# Patient Record
Sex: Male | Born: 1950 | Race: White | Hispanic: No | Marital: Married | Smoking: Former smoker
Health system: Southern US, Community
[De-identification: ages and names within clinical notes are randomized; demographics above are authoritative.]

## PROBLEM LIST (undated history)

## (undated) ENCOUNTER — Emergency Department (HOSPITAL_COMMUNITY): Admission: EM | Payer: Self-pay | Source: Home / Self Care

## (undated) DIAGNOSIS — J069 Acute upper respiratory infection, unspecified: Secondary | ICD-10-CM

## (undated) DIAGNOSIS — M1990 Unspecified osteoarthritis, unspecified site: Secondary | ICD-10-CM

## (undated) DIAGNOSIS — I10 Essential (primary) hypertension: Secondary | ICD-10-CM

## (undated) HISTORY — PX: OTHER SURGICAL HISTORY: SHX169

## (undated) HISTORY — PX: JOINT REPLACEMENT: SHX530

## (undated) HISTORY — PX: BACK SURGERY: SHX140

## (undated) HISTORY — PX: TONSILLECTOMY: SUR1361

---

## 2006-07-31 ENCOUNTER — Ambulatory Visit (HOSPITAL_BASED_OUTPATIENT_CLINIC_OR_DEPARTMENT_OTHER): Admission: RE | Admit: 2006-07-31 | Discharge: 2006-07-31 | Payer: Self-pay | Admitting: Orthopaedic Surgery

## 2007-01-17 ENCOUNTER — Encounter: Admission: RE | Admit: 2007-01-17 | Discharge: 2007-01-17 | Payer: Self-pay | Admitting: Orthopaedic Surgery

## 2007-03-04 ENCOUNTER — Inpatient Hospital Stay (HOSPITAL_COMMUNITY): Admission: RE | Admit: 2007-03-04 | Discharge: 2007-03-06 | Payer: Self-pay | Admitting: Orthopaedic Surgery

## 2010-12-18 NOTE — Op Note (Signed)
Ricardo Anthony, Ricardo Anthony              ACCOUNT NO.:  [id number]   MEDICAL RECORD NO.:  [id number]          PATIENT TYPE:  INP   LOCATION:  2899                         FACILITY:  [facility]   PHYSICIAN:  Sharolyn Douglas, M.D.        DATE OF BIRTH:  1951/07/15   DATE OF PROCEDURE:  03/04/2007  DATE OF DISCHARGE:                               OPERATIVE REPORT   DIAGNOSIS:  Recurrent lumbar multilevel spinal stenosis.   PROCEDURE:  Revision lumbar laminectomy L2-3, L3-4, L4-5 and L5-S1 with  wide decompression of the thecal sac and nerve roots bilaterally.   SURGEON:  Sharolyn Douglas, MD.   ASSISTANT:  Aura Fey. Bobbe Medico.   ANESTHESIA:  General endotracheal.   ESTIMATED BLOOD LOSS:  100 mL.   COMPLICATIONS:  None.   NEEDLE AND SPONGE COUNT:  Correct.   INDICATIONS:  The patient is a pleasant 60 year old male with chronic  persistent back and bilateral lower extremity pain.  He has had a  previous lumbar laminectomy at L4-5.  He has failed all attempts other  conservative treatments and now elects to undergo revision laminectomy  from L2-S1.  His imaging studies show multilevels of disk degeneration  with chronic disk herniation and posterior element hypertrophy.  There  is a spinal stenosis most severe at L4-5 but also significant at the  other levels.  He has a good understanding of the surgery and has  elected to proceed.   PROCEDURE:  After informed consent he was taken to the operating room.  He underwent general endotracheal anesthesia without difficulty, was  given prophylactic IV antibiotics.  He was carefully turned prone onto  the Wilson frame.  All bony prominences padded.  Face eyes protected at  all times.  Back prepped, draped usual sterile fashion.  The previous  incision was utilized and extended several centimeters in each  direction.  Dissection was carried sharply through the scar tissue and a  subperiosteal exposure was carried out to the facette joint capsules.  The pars  interarticularis was identified, L2, L3, L4, and L5  bilaterally.  Deep retractors were placed an intraoperative x-ray was  taken to confirm the levels.  We then began the process of removing the  residual spinous process and lamina of L4 and L5.  We carried the  decompression up to L2 removing the entire spinous process of L2 and L3  as well.  We then used a high-speed bur to thin the lamina.  Care was  taken to protect the pars interarticularis.  We then used a Kerrison  punches along with loupes and headlight magnification to remove the  thinned lamina and ligamentum flavum which was hypertrophied.  Once the  central decompression was complete we began the process of the lateral  recess decompression.  We encountered significant scar primarily on the  right side at L3-4, L4-5 and this was carefully dissected using the  curettes.  The decompression was carried out flush with the pedicles.  We identified each nerve root  L3, L4, L5 and S1 bilaterally and confirmed that they were decompressed  in the lateral  recess and also within the foramen.  We then inspected  the disk at each level and found that there were chronic disk  herniations which were calcified.  We attempted to enter the disk space  on the left side at L4-5; however, the disk itself was calcified and  therefore it was not felt that further dissection would be possible  without completing a fusion.  We therefore elected to leave the anterior  chronic disk herniations.  Again we felt that posteriorly there was an  adequate decompression and each nerve root was exiting freely.  Hemostasis was achieved using Gelfoam and FloSeal.  A deep Hemovac drain  was left in place.  The deep fascia closed with a running #1 Vicryl  suture.  Subcutaneous layer closed with 0-0 Vicryl and 2-0 Vicryl  followed by a running 3-0 subcuticular Vicryl suture on the skin edges.  Dermabond was applied.  Sterile dressing placed.  Patient turned supine,   extubated without difficulty and transferred to recovery in stable  condition.   It should be noted my assistant Orlin Hilding, PA was present throughout  the procedure.  She assisted with the laminectomies using loupes and  headlight magnification.  She provided suction and retraction of the  neural elements.  She then assisted me with wound closure.      Sharolyn Douglas, M.D.  Electronically Signed     MC/MEDQ  D:  03/04/2007  T:  03/05/2007  Job:  [id number]

## 2010-12-21 NOTE — Op Note (Signed)
NAMEVENSON, FERENCZ              ACCOUNT NO.:  [id number]   MEDICAL RECORD NO.:  [id number]          PATIENT TYPE:  AMB   LOCATION:  DSC                          FACILITY:  [facility]   PHYSICIAN:  Sharolyn Douglas, M.D.        DATE OF BIRTH:  11/18/1950   DATE OF PROCEDURE:  07/31/2006  DATE OF DISCHARGE:                               OPERATIVE REPORT   DIAGNOSIS:  Left L3-4 lumbar radiculopathy.   PROCEDURES:  1. Left L3-4 transforaminal epidural steroid injection.  2. Fluoroscopic imaging used for needle placement during the above      injection.   SURGEON:  Sharolyn Douglas, M.D.   ASSISTANT:  None.   ANESTHESIA:  MAC, plus local.   COMPLICATIONS:  None.   INDICATIONS:  The patient is a pleasant 60 year old male with left lower  extremity radiculopathy thought to be secondary to the L3-4 segment.  He  now presents or left L3-4 transforaminal epidural injection in hopes of  improving his symptoms.  The risks, benefits and alternatives were  reviewed.   DESCRIPTION OF PROCEDURES:  After informed consent, he was taken to the  operating room.  He underwent sedation by Anesthesia.  He was turned  prone onto the operating room table.  The back was prepped and draped in  the usual sterile fashion.  Fluoroscopy was brought into the field, and  oblique imaging of the L3-4 segment was obtained.  Using a 22-gauge  Quincke spinal needle, the needle was advanced under the left L3  pedicle.  Aspiration showed no blood or CSF.  A small amount of  Omnipaque was injected, which showed some epidural spread medial to the  pedicle.  The patient immediately had concordant pain in his left leg,  which was identified to be very similar to his home pain.  Depo-Medrol  80 mg, 1 cc was injected, along with 4 cc 1% preservative-free  lidocaine.  The patient immediately had resolution of his pain.  The  needle was removed.  He was transferred to recovery in stable condition,  able to move his upper and lower  extremities, neurologically intact.  He  was comfortable in the holding area.  I reviewed post-injection  instructions with him.  He will follow up in 2 weeks.      Sharolyn Douglas, M.D.  Electronically Signed     MC/MEDQ  D:  07/31/2006  T:  08/01/2006  Job:  [id number]

## 2011-03-06 ENCOUNTER — Other Ambulatory Visit: Payer: Self-pay | Admitting: Orthopaedic Surgery

## 2011-03-06 ENCOUNTER — Ambulatory Visit (HOSPITAL_COMMUNITY)
Admission: RE | Admit: 2011-03-06 | Discharge: 2011-03-06 | Disposition: A | Discharge status: Home / Self Care | Payer: BC Managed Care – PPO | Source: Ambulatory Visit | Attending: Orthopaedic Surgery | Admitting: Orthopaedic Surgery

## 2011-03-06 ENCOUNTER — Encounter (HOSPITAL_COMMUNITY): Payer: BC Managed Care – PPO

## 2011-03-06 ENCOUNTER — Other Ambulatory Visit (HOSPITAL_COMMUNITY): Payer: Self-pay | Admitting: Orthopaedic Surgery

## 2011-03-06 DIAGNOSIS — M169 Osteoarthritis of hip, unspecified: Secondary | ICD-10-CM

## 2011-03-06 DIAGNOSIS — M1610 Unilateral primary osteoarthritis, unspecified hip: Secondary | ICD-10-CM | POA: Insufficient documentation

## 2011-03-06 DIAGNOSIS — I10 Essential (primary) hypertension: Secondary | ICD-10-CM | POA: Insufficient documentation

## 2011-03-06 DIAGNOSIS — Z01811 Encounter for preprocedural respiratory examination: Secondary | ICD-10-CM | POA: Insufficient documentation

## 2011-03-06 LAB — BASIC METABOLIC PANEL
Chloride: 99 mEq/L (ref 96–112)
GFR calc Af Amer: >60 mL/min (ref >60)
GFR calc non Af Amer: >60 mL/min (ref >60)
Potassium: 3.7 mEq/L (ref 3.5–5.1)
Sodium: 135 mEq/L (ref 135–145)

## 2011-03-06 LAB — APTT: aPTT: 36 seconds (ref 24–37)

## 2011-03-06 LAB — CBC
HCT: 38.5 % — ABNORMAL LOW (ref 39.0–52.0)
MCHC: 34.8 g/dL (ref 30.0–36.0)
Platelets: 162 10*3/uL (ref 150–400)
RDW: 12.9 % (ref 11.5–15.5)
WBC: 5.3 10*3/uL (ref 4.0–10.5)

## 2011-03-06 LAB — PROTIME-INR: INR: 0.98 (ref 0.00–1.49)

## 2011-03-07 ENCOUNTER — Encounter (HOSPITAL_COMMUNITY): Payer: BC Managed Care – PPO

## 2011-03-15 ENCOUNTER — Inpatient Hospital Stay (HOSPITAL_COMMUNITY): Payer: BC Managed Care – PPO

## 2011-03-15 ENCOUNTER — Inpatient Hospital Stay (HOSPITAL_COMMUNITY)
Admission: RE | Admit: 2011-03-15 | Discharge: 2011-03-18 | DRG: 818 | Disposition: A | Discharge status: Home Health | Payer: BC Managed Care – PPO | Source: Ambulatory Visit | Attending: Orthopaedic Surgery | Admitting: Orthopaedic Surgery

## 2011-03-15 DIAGNOSIS — I10 Essential (primary) hypertension: Secondary | ICD-10-CM | POA: Diagnosis present

## 2011-03-15 DIAGNOSIS — M169 Osteoarthritis of hip, unspecified: Principal | ICD-10-CM | POA: Diagnosis present

## 2011-03-15 DIAGNOSIS — E119 Type 2 diabetes mellitus without complications: Secondary | ICD-10-CM | POA: Diagnosis present

## 2011-03-15 DIAGNOSIS — Z87891 Personal history of nicotine dependence: Secondary | ICD-10-CM

## 2011-03-15 DIAGNOSIS — M1610 Unilateral primary osteoarthritis, unspecified hip: Principal | ICD-10-CM | POA: Diagnosis present

## 2011-03-15 LAB — GLUCOSE, CAPILLARY: Glucose-Capillary: 178 mg/dL — ABNORMAL HIGH (ref 70–99)

## 2011-03-15 LAB — TYPE AND SCREEN: ABO/RH(D): O POS

## 2011-03-15 LAB — ABO/RH: ABO/RH(D): O POS

## 2011-03-16 LAB — BASIC METABOLIC PANEL
BUN: 9 mg/dL (ref 6–23)
GFR calc Af Amer: >60 mL/min (ref >60)
GFR calc non Af Amer: >60 mL/min (ref >60)
Potassium: 3.5 mEq/L (ref 3.5–5.1)
Sodium: 131 mEq/L — ABNORMAL LOW (ref 135–145)

## 2011-03-16 LAB — CBC
MCV: 87.4 fL (ref 78.0–100.0)
Platelets: 146 10*3/uL — ABNORMAL LOW (ref 150–400)
RBC: 3.49 MIL/uL — ABNORMAL LOW (ref 4.22–5.81)
WBC: 8.8 10*3/uL (ref 4.0–10.5)

## 2011-03-16 NOTE — Op Note (Signed)
NAMEJAYMISON, LUBER NO.:  [id number]  MEDICAL RECORD NO.:  [id number]  LOCATION:  1615                         FACILITY:  [facility]  PHYSICIAN:  Vanita Panda. Magnus Ivan, M.D.DATE OF BIRTH:  June 02, 1951  DATE OF PROCEDURE:  03/15/2011 DATE OF DISCHARGE:                              OPERATIVE REPORT   PREOPERATIVE DIAGNOSES:  Severe osteoarthritis and degenerative joint disease, right hip.  POSTOPERATIVE DIAGNOSES:  Severe osteoarthritis and degenerative joint disease, right hip.  PROCEDURE:  Right total hip arthroplasty with direct anterior approach.  IMPLANTS:  Equities trader S hybrid component with Gription size 52, size 36 neutral plus 4 polyethylene liner, Corail HA coated femoral component size 11 (KLA), and ceramic 36 plus 1.5 hip ball.  SURGEON:  Vanita Panda. Magnus Ivan, M.D.  ANESTHESIA:  General.  ANTIBIOTICS:  200 mg clindamycin.  BLOOD LOSS:  700 cc  COMPLICATIONS:  None.  INDICATIONS:  Mr. Olguin is a 60 year old gentleman with severe bilateral hip osteoarthritis with the right hurting worse than the left. 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  The pain has been significant enough that he is at the point wish to proceed with a total hip arthroplasty.  The risks and benefits of the surgery has been explained to him in detail.  He does wish the previous surgery due to the fact he has failed conservative treatment, he has failed injections, he has got bone-on- bone documented through x-rays as well.  He understands in detail the risks and benefits of this.  DESCRIPTION OF THE PROCEDURE:  After informed consent was obtained, appropriate right hip was marked, and he was brought to the operating room and general anesthesia was attained when he was in the stretcher. A Foley catheter was placed and then traction boots were placed on both feet.  He was then placed on the Hana Fracture  Table with perineal post placed on both feet in traction boots with no traction applied.  His right hip was then prepped and draped with DuraPrep and sterile drapes. Time-out was called __________ the right hip.  I then made an incision 1 cm distal and 3 cm posterior to the anterior superior iliac spine.  I secured the incision obliquely down the leg.  I dissected down to the tensor fascia lata and then tensor fascia lata was divided longitudinally to allow me to proceed with the direct anterior approach. A curved retractor was placed around the lateral neck; and then up under the rectus femoris, curved retractor was placed medially.  I was then able to coagulate the lateral femoral circumflex vessels.  I then divided the hip capsule and put the cobras within the hip capsule to expose the femoral neck.  __________ was very short femoral neck, so I was able to directly visualize my femoral neck cut as well as see them under direct fluoroscopic guidance.  I made the femoral neck cut and then placed a corkscrew guide in the femoral head and then removed the femoral head in its entirety.  I then cleaned the acetabulum of debris and began reaming from size 42 reamer up to size 51 with the last few reamers  placed under direct fluoroscopic guidance to allow me to assess my inclination and anteversion.  I then placed the real size 52 acetabular component with Gription.  I placed the real 36 plus 4 neutral polyethylene liner.  Attention was then turned to the femur.  With all traction off the bed, I was able to externally rotate the femur to 90 degrees, then I extended and adducted hip.  This allowed access to the femoral canal.  We were able to clean tissue from around the greater trochanter, and then I used a box-cutting guide and broaches to begin broaching from a size 8 broach up to a size 11 broach.  The 11 broach felt to be solid and stable, so a trial of 36 plus 1.5 femoral head with a KLA  high offset varus neck and reduced this into the acetabulum.  It was felt to be stable, and he had no shock and stable rotation.  I felt that his leg lengths were near equal as well.  I then removed all trial instrumentation and trial components and placed the real KLA size 11 femoral component from Corail without difficulty.  We replaced the real 36 plus 1.5 ceramic hip ball and reduced this back into the acetabulum. I then copiously irrigated tissues and closed the joint capsule with interrupted #1 Ethibond.  I reapproximated the tensor fascia lata with a running #1 Vicryl followed by a 2-0 Vicryl in subcutaneous tissue and staples on the skin.  Xeroform followed by pad and sterile dressings applied.  The patient was taken off the Hana table, awakened, extubated, and taken to recovery room in stable condition.  All final counts were correct and there were no complications noted.     Vanita Panda. Magnus Ivan, M.D.     CYB/MEDQ  D:  03/15/2011  T:  03/16/2011  Job:  [id number]  Electronically Signed by Doneen Poisson M.D. on 03/16/2011 10:31:37 AM

## 2011-03-16 NOTE — H&P (Signed)
  NAMEBRINSON, TOZZI NO.:  [id number]  MEDICAL RECORD NO.:  [id number]  LOCATION:  1615                         FACILITY:  [facility]  PHYSICIAN:  Vanita Panda. Magnus Ivan, M.D.DATE OF BIRTH:  February 03, 1951  DATE OF ADMISSION:  03/15/2011 DATE OF DISCHARGE:                             HISTORY & PHYSICAL   CHIEF COMPLAINT:  Bilateral hip pain, right worse than left.  HISTORY OF PRESENT ILLNESS:  Mr. Pennella is a 60 year old gentleman who is well-known to me.  He has severe pain in his hips for sometime and x- ray showed significant arthritis in his hips and what I believe is likely dysplastic hips with short femoral necks.  He is experiencing bone-on-bone type of wear and he has got to the point where he wishes to proceed with a total hip arthroplasty.  The risks and benefits of this had been explained to him in length.  He had steroid injections even in his hips and this has not helped.  He has tried everything, but has gotten to where it has impacted his daily life and his activities of daily living.  He wishes to proceed with a total hip arthroplasty at this standpoint and he understands the risks and benefits of this and what it involves.  PAST MEDICAL HISTORY: 1. Arthritis. 2. Diabetes. 3. High blood pressure.  MEDICATIONS:  Amlodipine, quinapril, low-dose aspirin, and multivitamin.  ALLERGIES:  No known drug allergies.  FAMILY MEDICAL HISTORY:  Diabetes and high blood pressure.  SOCIAL HISTORY:  He is married.  He is a Naval architect.  He does not smoke, but he did smoke for about 35 years.  He does drink alcohol on occasion.  REVIEW OF SYSTEMS:  Negative for chest pain, shortness of breath, fever, chills, nausea, vomiting.  PHYSICAL EXAMINATION:  VITAL SIGNS:  He is afebrile with stable vital signs. GENERAL:  He is alert and oriented x3, no acute distress, obvious discomfort. HEENT:  Normocephalic, atraumatic.  Pupils equal, round, and reactive  to light. NECK:  Supple. LUNGS:  Clear to auscultation bilaterally. HEART:  Regular rate and rhythm. ABDOMEN:  Benign. EXTREMITIES:  His leg lengths appear equal.  He is neurovascularly intact.  He has severe pain with internal and external rotation of both hips.  X-rays reviewed and showed significant deformity of his right and left hips with bone-on-bone wear, short femoral necks, and dysplastic acetabulum.  ASSESSMENT:  This is a 60 year old gentleman with end-stage arthritis of his hips, right worse than left.  PLAN:  We will proceed today with a right total hip arthroplasty.  The risks and benefits of this again have been understood by him in detail.     Vanita Panda. Magnus Ivan, M.D.     CYB/MEDQ  D:  03/15/2011  T:  03/15/2011  Job:  [id number]  Electronically Signed by Doneen Poisson M.D. on 03/16/2011 10:31:35 AM

## 2011-03-17 LAB — CBC
Platelets: 134 10*3/uL — ABNORMAL LOW (ref 150–400)
RBC: 3.32 MIL/uL — ABNORMAL LOW (ref 4.22–5.81)
RDW: 12.7 % (ref 11.5–15.5)
WBC: 7.8 10*3/uL (ref 4.0–10.5)

## 2011-03-17 LAB — BASIC METABOLIC PANEL
CO2: 29 mEq/L (ref 19–32)
Calcium: 8.2 mg/dL — ABNORMAL LOW (ref 8.4–10.5)
Creatinine, Ser: 0.55 mg/dL (ref 0.50–1.35)
GFR calc non Af Amer: >60 mL/min (ref >60)
Sodium: 134 mEq/L — ABNORMAL LOW (ref 135–145)

## 2011-03-18 LAB — CBC
HCT: 26.2 % — ABNORMAL LOW (ref 39.0–52.0)
Hemoglobin: 9.2 g/dL — ABNORMAL LOW (ref 13.0–17.0)
RDW: 12.7 % (ref 11.5–15.5)
WBC: 7 10*3/uL (ref 4.0–10.5)

## 2011-03-18 LAB — BASIC METABOLIC PANEL
BUN: 9 mg/dL (ref 6–23)
Chloride: 98 mEq/L (ref 96–112)
Creatinine, Ser: 0.61 mg/dL (ref 0.50–1.35)

## 2011-03-19 NOTE — Discharge Summary (Signed)
  NAMELEOPOLD, SMYERS NO.:  [id number]  MEDICAL RECORD NO.:  [id number]  LOCATION:  1615                         FACILITY:  [facility]  PHYSICIAN:  Vanita Panda. Magnus Ivan, M.D.DATE OF BIRTH:  11-15-1950  DATE OF ADMISSION:  03/15/2011 DATE OF DISCHARGE:  03/18/2011                              DISCHARGE SUMMARY   PREOPERATIVE DIAGNOSES:  Severe osteoarthritis and degenerative joint disease, right hip.  DISCHARGE DIAGNOSES:  Severe osteoarthritis and degenerative joint disease, right hip.  PROCEDURE:  Right total hip arthroplasty on March 15, 2011.  HOSPITAL COURSE:  Mr. Hissong is a 60 year old gentleman with debilitating arthritis of his right hip.  He was taken to the operating room on March 15, 2011 where he underwent a direct anterior total hip arthroplasty.  He tolerated this procedure well, was admitted as inpatient to the orthopedic floor of [facility].  His hospital course was uneventful.  He did have acute blood loss anemia with hemoglobin down to 9.2 but which did not require any type of treatment. By the day of discharge, he was ambulating well.  He had no hip precautions with physical therapy working with balance, gait training, and mobility.  It was felt he can be discharged safely to home.  His incision was clean, dry and intact.  He was tolerating pain medications as well as oral diet.  DISPOSITION:  Discharged to home.  DISCHARGE MEDICATIONS: 1. Percocet. 2. Robaxin. 3. Xarelto. 4. Continue same home medications including     quinapril/hydrochlorothiazide, Humira, hydroxychloroquine, folate,     multivitamin, Colace, baby aspirin, amlodipine, leflunomide.  DISCHARGE INSTRUCTIONS:  While he is at home, he can get his incision wet at shower starting this Wednesday.  He will continue to work with home health therapy with balance, gait training, coordination and no hip precautions.  A followup appointment will be established in  my office in 2 weeks.     Vanita Panda. Magnus Ivan, M.D.     CYB/MEDQ  D:  03/18/2011  T:  03/18/2011  Job:  [id number]  Electronically Signed by Doneen Poisson M.D. on 03/19/2011 08:59:34 PM

## 2011-05-20 LAB — APTT: aPTT: 36

## 2011-05-20 LAB — DIFFERENTIAL
Basophils Absolute: 0
Eosinophils Relative: 1
Lymphocytes Relative: 34
Lymphs Abs: 2.2
Monocytes Absolute: 0.6
Neutro Abs: 3.6

## 2011-05-20 LAB — TYPE AND SCREEN: Antibody Screen: NEGATIVE

## 2011-05-20 LAB — COMPREHENSIVE METABOLIC PANEL
ALT: 30
BUN: 12
CO2: 28
Calcium: 9.7
GFR calc Af Amer: >60
GFR calc non Af Amer: >60
Glucose, Bld: 110 — ABNORMAL HIGH
Sodium: 133 — ABNORMAL LOW

## 2011-05-20 LAB — URINALYSIS, ROUTINE W REFLEX MICROSCOPIC
Glucose, UA: NEGATIVE
Ketones, ur: NEGATIVE
Nitrite: NEGATIVE
Protein, ur: NEGATIVE
pH: 6.5

## 2011-05-20 LAB — CBC
MCHC: 34.1
MCV: 95
Platelets: 211
WBC: 6.4

## 2011-05-20 LAB — URINE CULTURE: Culture: NO GROWTH

## 2011-05-20 LAB — PROTIME-INR: Prothrombin Time: 13.4

## 2011-06-13 ENCOUNTER — Encounter (HOSPITAL_COMMUNITY): Payer: Self-pay | Admitting: Pharmacy Technician

## 2011-06-21 ENCOUNTER — Encounter (HOSPITAL_COMMUNITY)
Admission: RE | Admit: 2011-06-21 | Discharge: 2011-06-21 | Disposition: A | Discharge status: Home / Self Care | Payer: BC Managed Care – PPO | Source: Ambulatory Visit | Attending: Orthopaedic Surgery | Admitting: Orthopaedic Surgery

## 2011-06-21 ENCOUNTER — Other Ambulatory Visit (HOSPITAL_COMMUNITY): Payer: Self-pay

## 2011-06-21 ENCOUNTER — Encounter (HOSPITAL_COMMUNITY): Payer: Self-pay

## 2011-06-21 HISTORY — DX: Essential (primary) hypertension: I10

## 2011-06-21 HISTORY — DX: Unspecified osteoarthritis, unspecified site: M19.90

## 2011-06-21 HISTORY — DX: Acute upper respiratory infection, unspecified: J06.9

## 2011-06-21 LAB — BASIC METABOLIC PANEL
BUN: 11 mg/dL (ref 6–23)
CO2: 27 mEq/L (ref 19–32)
Chloride: 101 mEq/L (ref 96–112)
GFR calc non Af Amer: >90 mL/min (ref >90)
Glucose, Bld: 110 mg/dL — ABNORMAL HIGH (ref 70–99)
Potassium: 4.1 mEq/L (ref 3.5–5.1)

## 2011-06-21 LAB — CBC
HCT: 39.1 % (ref 39.0–52.0)
Hemoglobin: 13.3 g/dL (ref 13.0–17.0)
MCHC: 34 g/dL (ref 30.0–36.0)

## 2011-06-21 LAB — APTT: aPTT: 35 seconds (ref 24–37)

## 2011-06-21 LAB — URINALYSIS, ROUTINE W REFLEX MICROSCOPIC
Glucose, UA: NEGATIVE mg/dL
Ketones, ur: NEGATIVE mg/dL
Leukocytes, UA: NEGATIVE
Nitrite: NEGATIVE
Specific Gravity, Urine: 1.009 (ref 1.005–1.030)
pH: 5.5 (ref 5.0–8.0)

## 2011-06-21 LAB — SURGICAL PCR SCREEN
MRSA, PCR: NEGATIVE
Staphylococcus aureus: NEGATIVE

## 2011-06-21 MED ORDER — CHLORHEXIDINE GLUCONATE 4 % EX LIQD: 60.0000 mL | Freq: Once | CUTANEOUS | Status: DC

## 2011-06-21 MED ORDER — CHLORHEXIDINE GLUCONATE 4 % EX LIQD
60.0000 mL | Freq: Once | CUTANEOUS | Status: DC
Start: 2011-06-21 — End: 2011-06-21

## 2011-06-21 NOTE — Pre-Procedure Instructions (Signed)
Faxed clarification request to Dr Magnus Ivan regarding ancef order- allergic to penicillin- 06/21/11 with confirmation

## 2011-06-21 NOTE — Patient Instructions (Signed)
20 Duvid Smalls  06/21/2011   Your procedure is scheduled on:  06/28/2011   [id number]  FRIDAY  Report to [facility] at 0825 AM.  Call this number if you have problems the morning of surgery: [phone]   Remember:   Do not eat food:After Midnight. Thursday NIGHT  Do not drink clear liquids: After Midnight.  Thursday NIGHT  Take these medicines the morning of surgery with A SIP OF WATER: AMLODOPINE with sip water,   TRAMODOL if needed with sip water   Do not wear jewelry, make-up or nail polish.  Do not wear lotions, powders, or perfumes. You may wear deodorant.  Do not shave 48 hours prior to surgery.  Do not bring valuables to the hospital.  Contacts, dentures or bridgework may not be worn into surgery.  Leave suitcase in the car. After surgery it may be brought to your room.  For patients admitted to the hospital, checkout time is 11:00 AM the day of discharge.   Patients discharged the day of surgery will not be allowed to drive home.  Name and phone number of your driver: wife  Special Instructions: CHG Shower Use Special Wash: 1/2 bottle night before surgery and 1/2 bottle morning of surgery. REGULAR SOAP FACE AND PRIVATES   Please read over the following fact sheets that you were given: MRSA Information

## 2011-06-24 ENCOUNTER — Encounter (HOSPITAL_COMMUNITY): Payer: Self-pay

## 2011-06-24 NOTE — Pre-Procedure Instructions (Signed)
EKG obtained  From [facility] dated 5/12 on chart. Also eccho from 2/12 on chart

## 2011-06-24 NOTE — Pre-Procedure Instructions (Signed)
Chest 8/12 report in Epic

## 2011-06-27 LAB — TYPE AND SCREEN
ABO/RH(D): O POS
Antibody Screen: NEGATIVE

## 2011-06-28 ENCOUNTER — Inpatient Hospital Stay (HOSPITAL_COMMUNITY): Payer: BC Managed Care – PPO

## 2011-06-28 ENCOUNTER — Encounter (HOSPITAL_COMMUNITY): Payer: Self-pay | Admitting: Anesthesiology

## 2011-06-28 ENCOUNTER — Inpatient Hospital Stay (HOSPITAL_COMMUNITY)
Admission: RE | Admit: 2011-06-28 | Discharge: 2011-07-01 | DRG: 818 | Disposition: A | Discharge status: Home Health | Payer: BC Managed Care – PPO | Source: Ambulatory Visit | Attending: Orthopaedic Surgery | Admitting: Orthopaedic Surgery

## 2011-06-28 ENCOUNTER — Encounter (HOSPITAL_COMMUNITY): Payer: Self-pay | Admitting: *Deleted

## 2011-06-28 ENCOUNTER — Inpatient Hospital Stay (HOSPITAL_COMMUNITY): Payer: BC Managed Care – PPO | Admitting: Anesthesiology

## 2011-06-28 ENCOUNTER — Encounter (HOSPITAL_COMMUNITY)
Admission: RE | Disposition: A | Discharge status: Home Health | Payer: Self-pay | Source: Ambulatory Visit | Attending: Orthopaedic Surgery

## 2011-06-28 DIAGNOSIS — Z01812 Encounter for preprocedural laboratory examination: Secondary | ICD-10-CM

## 2011-06-28 DIAGNOSIS — I10 Essential (primary) hypertension: Secondary | ICD-10-CM | POA: Diagnosis present

## 2011-06-28 DIAGNOSIS — M1610 Unilateral primary osteoarthritis, unspecified hip: Principal | ICD-10-CM

## 2011-06-28 DIAGNOSIS — Q6589 Other specified congenital deformities of hip: Secondary | ICD-10-CM

## 2011-06-28 DIAGNOSIS — Z96649 Presence of unspecified artificial hip joint: Secondary | ICD-10-CM

## 2011-06-28 DIAGNOSIS — M169 Osteoarthritis of hip, unspecified: Principal | ICD-10-CM | POA: Diagnosis present

## 2011-06-28 DIAGNOSIS — E119 Type 2 diabetes mellitus without complications: Secondary | ICD-10-CM | POA: Diagnosis present

## 2011-06-28 HISTORY — PX: TOTAL HIP ARTHROPLASTY: SHX124

## 2011-06-28 LAB — GLUCOSE, CAPILLARY: Glucose-Capillary: 148 mg/dL — ABNORMAL HIGH (ref 70–99)

## 2011-06-28 SURGERY — ARTHROPLASTY, HIP, TOTAL, ANTERIOR APPROACH
Anesthesia: General | Site: Hip | Laterality: Left | Wound class: Clean

## 2011-06-28 MED ORDER — DIPHENHYDRAMINE HCL 50 MG/ML IJ SOLN: 12.5000 mg | Freq: Four times a day (QID) | INTRAMUSCULAR | Status: DC | PRN

## 2011-06-28 MED ORDER — RIVAROXABAN 10 MG PO TABS
10.0000 mg | ORAL_TABLET | ORAL | Status: DC
  Administered 2011-06-29 – 2011-06-30 (×2): 10 mg via ORAL
  Filled 2011-06-28 (×3): qty 1

## 2011-06-28 MED ORDER — GLYCOPYRROLATE 0.2 MG/ML IJ SOLN
INTRAMUSCULAR | Status: DC | PRN
  Administered 2011-06-28: .6 mg via INTRAVENOUS

## 2011-06-28 MED ORDER — CLINDAMYCIN PHOSPHATE 600 MG/50ML IV SOLN
600.0000 mg | INTRAVENOUS | Status: AC
  Administered 2011-06-28: 600 mg via INTRAVENOUS
  Filled 2011-06-28: qty 50

## 2011-06-28 MED ORDER — FLEET ENEMA 7-19 GM/118ML RE ENEM: 1.0000 | ENEMA | Freq: Every day | RECTAL | Status: DC | PRN

## 2011-06-28 MED ORDER — ONDANSETRON HCL 4 MG/2ML IJ SOLN
INTRAMUSCULAR | Status: DC | PRN
  Administered 2011-06-28: 4 mg via INTRAVENOUS

## 2011-06-28 MED ORDER — MENTHOL 3 MG MT LOZG
1.0000 | LOZENGE | OROMUCOSAL | Status: DC | PRN
  Filled 2011-06-28: qty 9

## 2011-06-28 MED ORDER — POLYETHYLENE GLYCOL 3350 17 G PO PACK
17.0000 g | PACK | Freq: Every day | ORAL | Status: DC | PRN
  Filled 2011-06-28: qty 1

## 2011-06-28 MED ORDER — ONDANSETRON HCL 4 MG PO TABS: 4.0000 mg | ORAL_TABLET | Freq: Four times a day (QID) | ORAL | Status: DC | PRN

## 2011-06-28 MED ORDER — SODIUM CHLORIDE 0.9 % IR SOLN
Status: DC | PRN
  Administered 2011-06-28: 1000 mL

## 2011-06-28 MED ORDER — DIPHENHYDRAMINE HCL 12.5 MG/5ML PO ELIX
12.5000 mg | ORAL_SOLUTION | Freq: Four times a day (QID) | ORAL | Status: DC | PRN
  Filled 2011-06-28: qty 5

## 2011-06-28 MED ORDER — HYDROXYCHLOROQUINE SULFATE 200 MG PO TABS
200.0000 mg | ORAL_TABLET | Freq: Every evening | ORAL | Status: DC
  Administered 2011-06-28 – 2011-06-30 (×3): 200 mg via ORAL
  Filled 2011-06-28 (×5): qty 1

## 2011-06-28 MED ORDER — PROPOFOL 10 MG/ML IV EMUL
INTRAVENOUS | Status: DC | PRN
  Administered 2011-06-28: 180 mg via INTRAVENOUS

## 2011-06-28 MED ORDER — LACTATED RINGERS IV SOLN: INTRAVENOUS | Status: DC

## 2011-06-28 MED ORDER — ROCURONIUM BROMIDE 100 MG/10ML IV SOLN
INTRAVENOUS | Status: DC | PRN
  Administered 2011-06-28: 50 mg via INTRAVENOUS

## 2011-06-28 MED ORDER — CLINDAMYCIN PHOSPHATE 600 MG/50ML IV SOLN
600.0000 mg | Freq: Four times a day (QID) | INTRAVENOUS | Status: AC
  Administered 2011-06-28 – 2011-06-29 (×3): 600 mg via INTRAVENOUS
  Filled 2011-06-28 (×4): qty 50

## 2011-06-28 MED ORDER — AMLODIPINE BESYLATE 10 MG PO TABS
10.0000 mg | ORAL_TABLET | Freq: Every day | ORAL | Status: DC
  Administered 2011-06-30 – 2011-07-01 (×2): 10 mg via ORAL
  Filled 2011-06-28 (×3): qty 1

## 2011-06-28 MED ORDER — LISINOPRIL 20 MG PO TABS
20.0000 mg | ORAL_TABLET | Freq: Every day | ORAL | Status: DC
  Administered 2011-06-28 – 2011-07-01 (×3): 20 mg via ORAL
  Filled 2011-06-28 (×4): qty 1

## 2011-06-28 MED ORDER — HYDROCHLOROTHIAZIDE 12.5 MG PO CAPS
12.5000 mg | ORAL_CAPSULE | Freq: Every day | ORAL | Status: DC
  Administered 2011-06-28 – 2011-07-01 (×3): 12.5 mg via ORAL
  Filled 2011-06-28 (×4): qty 1

## 2011-06-28 MED ORDER — MAGNESIUM HYDROXIDE 400 MG/5ML PO SUSP: 30.0000 mL | Freq: Two times a day (BID) | ORAL | Status: DC | PRN

## 2011-06-28 MED ORDER — PROMETHAZINE HCL 25 MG/ML IJ SOLN: 6.2500 mg | INTRAMUSCULAR | Status: DC | PRN

## 2011-06-28 MED ORDER — ALUM & MAG HYDROXIDE-SIMETH 200-200-20 MG/5ML PO SUSP: 30.0000 mL | ORAL | Status: DC | PRN

## 2011-06-28 MED ORDER — METOCLOPRAMIDE HCL 5 MG/ML IJ SOLN: 5.0000 mg | Freq: Three times a day (TID) | INTRAMUSCULAR | Status: DC | PRN

## 2011-06-28 MED ORDER — METHOCARBAMOL 500 MG PO TABS
500.0000 mg | ORAL_TABLET | Freq: Four times a day (QID) | ORAL | Status: DC | PRN
  Administered 2011-06-30 – 2011-07-01 (×2): 500 mg via ORAL
  Filled 2011-06-28 (×3): qty 1

## 2011-06-28 MED ORDER — MORPHINE SULFATE (PF) 1 MG/ML IV SOLN
INTRAVENOUS | Status: DC
  Administered 2011-06-28: 1 mg via INTRAVENOUS
  Administered 2011-06-28: 14:00:00 via INTRAVENOUS
  Administered 2011-06-28 (×2): 6 mg via INTRAVENOUS
  Administered 2011-06-29: 9 mg via INTRAVENOUS
  Administered 2011-06-29: 3 mg via INTRAVENOUS
  Administered 2011-06-29: 7.25 mg via INTRAVENOUS
  Administered 2011-06-29: 6 mg via INTRAVENOUS
  Administered 2011-06-29: 07:00:00 via INTRAVENOUS
  Administered 2011-06-30: 3 mg via INTRAVENOUS
  Administered 2011-06-30: 2.93 mg via INTRAVENOUS
  Administered 2011-06-30: 07:00:00 via INTRAVENOUS
  Administered 2011-06-30: 3 mg via INTRAVENOUS
  Filled 2011-06-28 (×3): qty 25

## 2011-06-28 MED ORDER — OXYCODONE HCL 5 MG PO TABS: 5.0000 mg | ORAL_TABLET | ORAL | Status: DC | PRN

## 2011-06-28 MED ORDER — NEOSTIGMINE METHYLSULFATE 1 MG/ML IJ SOLN
INTRAMUSCULAR | Status: DC | PRN
  Administered 2011-06-28: 3 mg via INTRAVENOUS

## 2011-06-28 MED ORDER — LEFLUNOMIDE 10 MG PO TABS
10.0000 mg | ORAL_TABLET | Freq: Every evening | ORAL | Status: DC
  Administered 2011-06-29 – 2011-06-30 (×2): 10 mg via ORAL
  Filled 2011-06-28 (×2): qty 1

## 2011-06-28 MED ORDER — ONDANSETRON HCL 4 MG/2ML IJ SOLN
4.0000 mg | Freq: Four times a day (QID) | INTRAMUSCULAR | Status: DC | PRN
  Administered 2011-06-28: 4 mg via INTRAVENOUS
  Filled 2011-06-28: qty 2

## 2011-06-28 MED ORDER — NALOXONE HCL 0.4 MG/ML IJ SOLN: 0.4000 mg | INTRAMUSCULAR | Status: DC | PRN

## 2011-06-28 MED ORDER — METOCLOPRAMIDE HCL 5 MG PO TABS
5.0000 mg | ORAL_TABLET | Freq: Three times a day (TID) | ORAL | Status: DC | PRN
  Filled 2011-06-28: qty 2

## 2011-06-28 MED ORDER — MIDAZOLAM HCL 5 MG/5ML IJ SOLN
INTRAMUSCULAR | Status: DC | PRN
  Administered 2011-06-28 (×2): 1 mg via INTRAVENOUS

## 2011-06-28 MED ORDER — HYDROMORPHONE HCL PF 1 MG/ML IJ SOLN
INTRAMUSCULAR | Status: DC | PRN
  Administered 2011-06-28: 0.5 mg via INTRAVENOUS
  Administered 2011-06-28: .5 mg via INTRAVENOUS
  Administered 2011-06-28 (×2): 0.5 mg via INTRAVENOUS

## 2011-06-28 MED ORDER — ACETAMINOPHEN 650 MG RE SUPP: 650.0000 mg | Freq: Four times a day (QID) | RECTAL | Status: DC | PRN

## 2011-06-28 MED ORDER — ACETAMINOPHEN 325 MG PO TABS: 650.0000 mg | ORAL_TABLET | Freq: Four times a day (QID) | ORAL | Status: DC | PRN

## 2011-06-28 MED ORDER — HYDROMORPHONE HCL PF 1 MG/ML IJ SOLN
0.2500 mg | INTRAMUSCULAR | Status: DC | PRN
  Administered 2011-06-28 (×4): 0.5 mg via INTRAVENOUS

## 2011-06-28 MED ORDER — MORPHINE SULFATE 2 MG/ML IJ SOLN: 1.0000 mg | INTRAMUSCULAR | Status: DC | PRN

## 2011-06-28 MED ORDER — LACTATED RINGERS IV SOLN
INTRAVENOUS | Status: DC | PRN
  Administered 2011-06-28 (×4): via INTRAVENOUS

## 2011-06-28 MED ORDER — FOLIC ACID 1 MG PO TABS
1.0000 mg | ORAL_TABLET | Freq: Every day | ORAL | Status: DC
  Administered 2011-06-28 – 2011-07-01 (×4): 1 mg via ORAL
  Filled 2011-06-28 (×4): qty 1

## 2011-06-28 MED ORDER — ONDANSETRON HCL 4 MG/2ML IJ SOLN: 4.0000 mg | Freq: Four times a day (QID) | INTRAMUSCULAR | Status: DC | PRN

## 2011-06-28 MED ORDER — QUINAPRIL-HYDROCHLOROTHIAZIDE 20-12.5 MG PO TABS: 1.0000 | ORAL_TABLET | Freq: Two times a day (BID) | ORAL | Status: DC

## 2011-06-28 MED ORDER — SODIUM CHLORIDE 0.9 % IJ SOLN: 9.0000 mL | INTRAMUSCULAR | Status: DC | PRN

## 2011-06-28 MED ORDER — ZOLPIDEM TARTRATE 5 MG PO TABS: 5.0000 mg | ORAL_TABLET | Freq: Every evening | ORAL | Status: DC | PRN

## 2011-06-28 MED ORDER — SODIUM CHLORIDE 0.9 % IV SOLN
INTRAVENOUS | Status: DC
  Administered 2011-06-28 – 2011-06-29 (×3): via INTRAVENOUS

## 2011-06-28 MED ORDER — HYDROCODONE-ACETAMINOPHEN 5-325 MG PO TABS
1.0000 | ORAL_TABLET | ORAL | Status: DC | PRN
  Administered 2011-06-30 – 2011-07-01 (×3): 2 via ORAL
  Filled 2011-06-28 (×4): qty 2

## 2011-06-28 MED ORDER — LIDOCAINE HCL (CARDIAC) 20 MG/ML IV SOLN
INTRAVENOUS | Status: DC | PRN
  Administered 2011-06-28: 100 mg via INTRAVENOUS

## 2011-06-28 MED ORDER — DOCUSATE SODIUM 100 MG PO CAPS
100.0000 mg | ORAL_CAPSULE | Freq: Two times a day (BID) | ORAL | Status: DC
  Administered 2011-06-28 – 2011-07-01 (×6): 100 mg via ORAL
  Filled 2011-06-28 (×8): qty 1

## 2011-06-28 MED ORDER — DEXTROSE 5 % IV SOLN
500.0000 mg | Freq: Four times a day (QID) | INTRAVENOUS | Status: DC | PRN
  Administered 2011-06-28: 500 mg via INTRAVENOUS
  Filled 2011-06-28 (×3): qty 5

## 2011-06-28 MED ORDER — FENTANYL CITRATE 0.05 MG/ML IJ SOLN
INTRAMUSCULAR | Status: DC | PRN
  Administered 2011-06-28 (×3): 50 ug via INTRAVENOUS
  Administered 2011-06-28: 100 ug via INTRAVENOUS

## 2011-06-28 MED ORDER — BISACODYL 5 MG PO TBEC: 10.0000 mg | DELAYED_RELEASE_TABLET | Freq: Every day | ORAL | Status: DC | PRN

## 2011-06-28 MED ORDER — FERROUS SULFATE 325 (65 FE) MG PO TABS
325.0000 mg | ORAL_TABLET | Freq: Three times a day (TID) | ORAL | Status: DC
  Administered 2011-06-28 – 2011-07-01 (×7): 325 mg via ORAL
  Filled 2011-06-28 (×10): qty 1

## 2011-06-28 MED ORDER — DIPHENHYDRAMINE HCL 12.5 MG/5ML PO ELIX: 12.5000 mg | ORAL_SOLUTION | ORAL | Status: DC | PRN

## 2011-06-28 MED ORDER — ACETAMINOPHEN 10 MG/ML IV SOLN
INTRAVENOUS | Status: DC | PRN
  Administered 2011-06-28: 1000 mg via INTRAVENOUS

## 2011-06-28 MED ORDER — PHENOL 1.4 % MT LIQD
1.0000 | OROMUCOSAL | Status: DC | PRN
  Filled 2011-06-28: qty 177

## 2011-06-28 MED ORDER — BISACODYL 10 MG RE SUPP: 10.0000 mg | Freq: Every day | RECTAL | Status: DC | PRN

## 2011-06-28 SURGICAL SUPPLY — 38 items
BAG SPEC THK2 15X12 ZIP CLS (MISCELLANEOUS) ×2
BAG ZIPLOCK 12X15 (MISCELLANEOUS) ×4 IMPLANT
BLADE SAW SGTL 18X1.27X75 (BLADE) ×2 IMPLANT
CELLS DAT CNTRL 66122 CELL SVR (MISCELLANEOUS) ×1 IMPLANT
CLOTH BEACON ORANGE TIMEOUT ST (SAFETY) ×2 IMPLANT
DRAPE C-ARM 42X72 X-RAY (DRAPES) ×2 IMPLANT
DRAPE ORTHO SPLIT 77X108 STRL (DRAPES) ×2
DRAPE STERI IOBAN 125X83 (DRAPES) ×2 IMPLANT
DRAPE SURG ORHT 6 SPLT 77X108 (DRAPES) ×1 IMPLANT
DRAPE U-SHAPE 47X51 STRL (DRAPES) ×6 IMPLANT
DRSG MEPILEX BORDER 4X8 (GAUZE/BANDAGES/DRESSINGS) ×2 IMPLANT
ELECT BLADE TIP CTD 4 INCH (ELECTRODE) ×3 IMPLANT
ELECT REM PT RETURN 9FT ADLT (ELECTROSURGICAL) ×2
ELECTRODE REM PT RTRN 9FT ADLT (ELECTROSURGICAL) ×1 IMPLANT
EVACUATOR 1/8 PVC DRAIN (DRAIN) IMPLANT
FACESHIELD LNG OPTICON STERILE (SAFETY) ×8 IMPLANT
GAUZE XEROFORM 1X8 LF (GAUZE/BANDAGES/DRESSINGS) ×2 IMPLANT
GAUZE XEROFORM 4X4 STRL (GAUZE/BANDAGES/DRESSINGS) ×1 IMPLANT
GLOVE BIO SURGEON STRL SZ7 (GLOVE) ×2 IMPLANT
GLOVE BIO SURGEON STRL SZ7.5 (GLOVE) ×2 IMPLANT
GLOVE BIOGEL PI IND STRL 7.5 (GLOVE) IMPLANT
GLOVE BIOGEL PI IND STRL 8 (GLOVE) ×1 IMPLANT
GLOVE BIOGEL PI INDICATOR 7.5 (GLOVE)
GLOVE BIOGEL PI INDICATOR 8 (GLOVE) ×1
GOWN STRL REIN XL XLG (GOWN DISPOSABLE) ×2 IMPLANT
KIT BASIN OR (CUSTOM PROCEDURE TRAY) ×2 IMPLANT
PACK TOTAL JOINT (CUSTOM PROCEDURE TRAY) ×2 IMPLANT
PADDING CAST COTTON 6X4 STRL (CAST SUPPLIES) ×2 IMPLANT
RETRACTOR WND ALEXIS 18 MED (MISCELLANEOUS) ×1 IMPLANT
RTRCTR WOUND ALEXIS 18CM MED (MISCELLANEOUS) ×2
STAPLER SKIN PROX WIDE 3.9 (STAPLE) IMPLANT
SUT ETHIBOND NAB CT1 #1 30IN (SUTURE) ×4 IMPLANT
SUT MNCRL AB 4-0 PS2 18 (SUTURE) ×2 IMPLANT
SUT VIC AB 1 CT1 36 (SUTURE) ×4 IMPLANT
SUT VIC AB 2-0 CT1 27 (SUTURE) ×4
SUT VIC AB 2-0 CT1 TAPERPNT 27 (SUTURE) ×2 IMPLANT
TOWEL OR 17X26 10 PK STRL BLUE (TOWEL DISPOSABLE) ×4 IMPLANT
TRAY FOLEY CATH 14FRSI W/METER (CATHETERS) ×2 IMPLANT

## 2011-06-28 NOTE — Brief Op Note (Signed)
06/28/2011  1:29 PM  PATIENT:  Camie Patience  60 y.o. male  PRE-OPERATIVE DIAGNOSIS:  Severe Left Hip Osteoarthritis  POST-OPERATIVE DIAGNOSIS:  Severe Left Hip Osteoarthritis  PROCEDURE:  Procedure(s): TOTAL HIP ARTHROPLASTY ANTERIOR APPROACH  SURGEON:  Surgeon(s): Kathryne Hitch  PHYSICIAN ASSISTANT: none  ASSISTANTS:    ANESTHESIA:   general  EBL:  Total I/O In: 3000 [I.V.:3000] Out: 1100 [Urine:250; Blood:850]  BLOOD ADMINISTERED:none  DRAINS: none   LOCAL MEDICATIONS USED:  NONE  SPECIMEN:  No Specimen  DISPOSITION OF SPECIMEN:  N/A  COUNTS:  YES  TOURNIQUET:  * No tourniquets in log *  DICTATION: .Other Dictation: Dictation Number [id number]  PLAN OF CARE: Admit to inpatient   PATIENT DISPOSITION:  PACU - hemodynamically stable.   Delay start of Pharmacological VTE agent (>24hrs) due to surgical blood loss or risk of bleeding:  {YES/NO/NOT APPLICABLE:20182

## 2011-06-28 NOTE — Anesthesia Postprocedure Evaluation (Signed)
  Anesthesia Post-op Note  Patient: Ricardo Anthony  Procedure(s) Performed:  TOTAL HIP ARTHROPLASTY ANTERIOR APPROACH  Patient Location: PACU  Anesthesia Type: General  Level of Consciousness: oriented and sedated  Airway and Oxygen Therapy: Patient Spontanous Breathing and Patient connected to nasal cannula oxygen  Post-op Pain: mild  Post-op Assessment: Post-op Vital signs reviewed, Patient's Cardiovascular Status Stable, Respiratory Function Stable and Patent Airway  Post-op Vital Signs: stable  Complications: No apparent anesthesia complications

## 2011-06-28 NOTE — H&P (Signed)
Ricardo Anthony is an 60 y.o. male.   Chief Complaint: Left hip pain, known osteoarthritis and congenital deformity HPI:  60 yo male well-know to me. Previous right total hip.  Has sever OA/DJD with left hip as well as congenital deformity.  Wants left total hip replacement at this point.  Past Medical History  Diagnosis Date  . Recurrent upper respiratory infection (URI)     states cold 2 weeks ago  . Diabetes mellitus     pre diabetes-diet controlled  . Arthritis     RHEUMATOID ARTHRITIS- diagnosed 6-7 yrs ago-  followed by  Dr   Lucillie Garfinkel in [place]  . Hypertension     states had stress test 3 years ago due to EKG shows previous MI, but denies- was told heart is "sitting abnormally and everything was OK"    Past Surgical History  Procedure Date  . Joint replacement     right hip replacement  . Arthroscopy right knee   . Back surgery     x2 for ruptured disc- lumbar  . Tonsillectomy   . Repair fractured right heel     History reviewed. No pertinent family history. Social History:  reports that he quit smoking about 20 years ago. He has never used smokeless tobacco. He reports that he drinks about .6 ounces of alcohol per week. He reports that he does not use illicit drugs.  Allergies:  Allergies  Allergen Reactions  . Penicillins Anaphylaxis    Medications Prior to Admission  Medication Dose Route Frequency Provider Last Rate Last Dose  . clindamycin (CLEOCIN) IVPB 600 mg  600 mg Intravenous 60 min Pre-Op Kathryne Hitch       No current outpatient prescriptions on file as of 06/28/2011.    No results found for this or any previous visit (from the past 48 hour(s)). No results found.  Review of Systems  Constitutional: Negative.   HENT: Negative.   Eyes: Negative.   Respiratory: Negative.   Cardiovascular: Negative.   Gastrointestinal: Negative.   Genitourinary: Negative.   Skin: Negative.   Neurological: Negative.   Psychiatric/Behavioral:  Negative.   All other systems reviewed and are negative.    Blood pressure 160/90, pulse 70, temperature 97.4 F (36.3 C), temperature source Oral, resp. rate 18, SpO2 96.00%. Physical Exam  Constitutional: He is oriented to person, place, and time. He appears well-developed and well-nourished.  HENT:  Head: Normocephalic and atraumatic.  Eyes: EOM are normal. Pupils are equal, round, and reactive to light.  Neck: Neck supple.  Cardiovascular: Normal rate and regular rhythm.   Respiratory: Effort normal.  GI: Soft. Bowel sounds are normal.  Musculoskeletal:       Left hip: He exhibits decreased range of motion, bony tenderness and crepitus.  Neurological: He is alert and oriented to person, place, and time. He has normal reflexes.  Skin: Skin is warm.  Psychiatric: He has a normal mood and affect.     Assessment/Plan To OR today for a left direct anterior total hip replacement. Understands risks of DVT, acute BL anemia and death.  Benefits include increased quality of life and decreased pain.  Kathryne Hitch 06/28/2011, 10:43 AM

## 2011-06-28 NOTE — Transfer of Care (Signed)
Immediate Anesthesia Transfer of Care Note  Patient: Ricardo Anthony  Procedure(s) Performed:  TOTAL HIP ARTHROPLASTY ANTERIOR APPROACH  Patient Location: PACU  Anesthesia Type: General  Level of Consciousness: awake and alert   Airway & Oxygen Therapy: Patient Spontanous Breathing and Patient connected to face mask oxygen  Post-op Assessment: Report given to PACU RN and Post -op Vital signs reviewed and stable  Post vital signs: Reviewed and stable  Complications: No apparent anesthesia complications

## 2011-06-28 NOTE — Anesthesia Preprocedure Evaluation (Signed)
Anesthesia Evaluation  Patient identified by MRN, date of birth, ID band Patient awake    Reviewed: Allergy & Precautions, H&P , NPO status , Patient's Chart, lab work & pertinent test results, reviewed documented beta blocker date and time   Airway Mallampati: II TM Distance: >3 FB Neck ROM: Full    Dental  (+) Dental Advisory Given and Partial Upper   Pulmonary neg pulmonary ROS,  clear to auscultation        Cardiovascular hypertension, Pt. on medications Regular Normal Denies cardiac symptoms   Neuro/Psych Negative Neurological ROS  Negative Psych ROS   GI/Hepatic negative GI ROS, Neg liver ROS,   Endo/Other  Diabetes mellitus-Diet controlled  Renal/GU negative Renal ROS  Genitourinary negative   Musculoskeletal negative musculoskeletal ROS (+)   Abdominal   Peds negative pediatric ROS (+)  Hematology negative hematology ROS (+)   Anesthesia Other Findings Front cap  Reproductive/Obstetrics negative OB ROS                           Anesthesia Physical Anesthesia Plan  ASA: III  Anesthesia Plan: General   Post-op Pain Management:    Induction: Intravenous  Airway Management Planned: Oral ETT  Additional Equipment:   Intra-op Plan:   Post-operative Plan: Extubation in OR  Informed Consent: I have reviewed the patients History and Physical, chart, labs and discussed the procedure including the risks, benefits and alternatives for the proposed anesthesia with the patient or authorized representative who has indicated his/her understanding and acceptance.     Plan Discussed with: CRNA and Surgeon  Anesthesia Plan Comments:         Anesthesia Quick Evaluation

## 2011-06-29 LAB — CBC
HCT: 29.6 % — ABNORMAL LOW (ref 39.0–52.0)
Hemoglobin: 10.2 g/dL — ABNORMAL LOW (ref 13.0–17.0)
RBC: 3.54 MIL/uL — ABNORMAL LOW (ref 4.22–5.81)

## 2011-06-29 LAB — BASIC METABOLIC PANEL
CO2: 30 mEq/L (ref 19–32)
Chloride: 95 mEq/L — ABNORMAL LOW (ref 96–112)
Glucose, Bld: 132 mg/dL — ABNORMAL HIGH (ref 70–99)
Potassium: 3.8 mEq/L (ref 3.5–5.1)
Sodium: 130 mEq/L — ABNORMAL LOW (ref 135–145)

## 2011-06-29 NOTE — Op Note (Signed)
Ricardo Anthony, Ricardo Anthony NO.:  [id number]  MEDICAL RECORD NO.:  [id number]  LOCATION:  [facility]                         FACILITY:  [facility]  PHYSICIAN:  Vanita Panda. Magnus Ivan, M.D.DATE OF BIRTH:  October 25, 1950  DATE OF PROCEDURE:  06/28/2011 DATE OF DISCHARGE:                              OPERATIVE REPORT   PREOPERATIVE DIAGNOSES:  End-stage arthritis and congenital deformity, left hip.  POSTOPERATIVE DIAGNOSES:  End-stage arthritis and congenital deformity, left hip.  PROCEDURE:  Left total hip arthroplasty through direct anterior approach.  IMPLANTS:  [employer] acetabular component size 36, neutral +4 polyethylene liner, size 11 KLA [place] femoral component with HA coating, size 11, size 36 +1.5 ceramic hip ball.  SURGEON:  Vanita Panda. Magnus Ivan, MD  ANESTHESIA:  General.  ANTIBIOTICS:  600 mg IV clindamycin.  ESTIMATED BLOOD LOSS:  850 cc.  COMPLICATIONS:  None.  INDICATIONS:  Ricardo Anthony is a 60 year old gentleman who is well known to me.  He has bilateral congenital hip deformity as well as severe osteoarthritis, and very short femoral neck.  He underwent successful right total hip arthroplasty earlier this year.  Now, he presents to have the left hip replaced.  He has the same disease on the left side as well.  He understands the risks and benefits of surgery with the risks including, acute blood loss anemia, DVT, nerve and vessel injury, and PE as well as death and the benefits being decreased pain and increased quality of life.  He does wish to proceed with surgery.  PROCEDURE DESCRIPTION:  After informed consent was obtained, appropriate left hip was marked.  He was brought to the operating room, and while he was on the stretcher, general anesthesia was then obtained.  A Foley catheter was placed and traction boots were placed on both his feet.  He was then placed on the Hana fracture table with perineal post in place and both legs  were in traction devices, but no traction applied.  His left hip was then prepped and draped with DuraPrep and sterile drapes. A time-out was called to identify correct patient, correct left hip.  I then made an incision 1 cm distal and 3 cm posterior to the anterior superior iliac spine and carried this obliquely down the leg.  I dissected down to the tensor fascia lata and divided this longitudinally and proceeded with a direct anterior approach to the hip.  A Cobra retractor was placed anteriorly, one lateral and one medial and divided the hip capsule.  I cauterized the lateral femoral circumflex vessels as well.  I then made my femoral neck cut just above the calcar region.  I used a corkscrew and removed the femoral head.  I cleaned the acetabulum with soft tissue and debris, and then began reamings from a size 42 reamer up to a size 51 with the last 2 reamers placed under direct fluoroscopy.  I then placed a real size 52 acetabular component with Gription and 3 screw holes, but I did not need to fill the screw holes. I then placed the neutral 36 +4 polyethylene liner.  Attention was then turned to the femur.  All traction was off with leg externally rotated  to 90 degrees.  We extended and adducted the hip to allow access to the femoral canal, and removed tissue from behind the greater trochanter, placed the retractors medially and laterally, and then began broaching from a size 8 broach up to a size 11 broach, which was exactly what we put on the other side.  I placed in the real size 11 stem and since we were at the same level, the real 36 +1.5 hip ball and reduced this into the acetabulum and surprisingly without trialing, we were able to get him exactly on leg length mainly due to the fact that we had the same offset and preoperative planning cut at the same level as his other hip. I externally rotated to 90 degrees and then internally rotated to 45 degrees and he was stable.   There was minimal shuck and his leg lengths under fluoroscopy were equal.  We then copiously irrigated the tissues and I closed the deep tissue with #1 Ethibond suture followed by running #1 in the tensor fascia lata, 2-0 Vicryl in the subcutaneous tissue and interrupted staples on the skin.  The patient was awakened, extubated, and taken to recovery room in stable condition.  All final counts were correct.  There were no complications noted.     Vanita Panda. Magnus Ivan, M.D.     CYB/MEDQ  D:  06/28/2011  T:  06/28/2011  Job:  [id number]

## 2011-06-29 NOTE — Progress Notes (Signed)
Physical Therapy Treatment Patient Details Name: Ricardo Anthony MRN: [id number] DOB: May 05, 1951 Today's Date: 06/29/2011 1404 - 1421 GT PT Assessment/Plan  PT - Assessment/Plan PT Plan: Discharge plan remains appropriate PT Frequency: 7X/week Follow Up Recommendations: Home health PT Equipment Recommended: None recommended by PT PT Goals  Acute Rehab PT Goals PT Goal: Supine/Side to Sit - Progress: Progressing toward goal PT Goal: Sit to Supine/Side - Progress: Progressing toward goal PT Transfer Goal: Sit to Stand/Stand to Sit - Progress: Progressing toward goal PT Goal: Ambulate - Progress: Progressing toward goal  PT Treatment Precautions/Restrictions  Restrictions Weight Bearing Restrictions: No LLE Weight Bearing: Weight bearing as tolerated Mobility (including Balance) Bed Mobility Sit to Supine - Right: 4: Min assist Sit to Supine - Right Details (indicate cue type and reason): VC and min assist with L LE Transfers Sit to Stand: 4: Min assist Sit to Stand Details (indicate cue type and reason): cues for use of UEs Stand to Sit: 4: Min assist Stand to Sit Details: cues for use of UEs and LE position Ambulation/Gait Ambulation/Gait Assistance: 4: Min assist Ambulation/Gait Assistance Details (indicate cue type and reason): cues for posture, position from RW and stride length Ambulation Distance (Feet): 125 Feet Assistive device: Rolling walker Gait Pattern: Step-to pattern    Exercise    End of Session PT - End of Session Activity Tolerance: Patient tolerated treatment well Patient left: in bed;with bed alarm set General Behavior During Session: [facility] for tasks performed Cognition: [facility] for tasks performed  Curt Oatis 06/29/2011, 2:23 PM

## 2011-06-29 NOTE — Progress Notes (Signed)
Subjective: 1 Day Post-Op Procedure(s) (LRB): TOTAL HIP ARTHROPLASTY ANTERIOR APPROACH (Left) Patient reports pain as mild.  Did well with therapy today.  Objective: Vital signs in last 24 hours: Temp:  [97.4 F (36.3 C)-98.7 F (37.1 C)] 97.7 F (36.5 C) (11/24 0600) Pulse Rate:  [71-87] 75  (11/24 0600) Resp:  [11-20] 20  (11/24 1312) BP: (96-185)/(59-110) 96/59 mmHg (11/24 1031) SpO2:  [96 %-100 %] 100 % (11/24 1312) FiO2 (%):  [100 %] 100 % (11/24 0852) Weight:  [96.616 kg (213 lb)] 213 lb (96.616 kg) (11/23 1700)  Intake/Output from previous day: 11/23 0701 - 11/24 0700 In: 4627.3 [I.V.:4627.3] Out: 3175 [Urine:2325; Blood:850] Intake/Output this shift: Total I/O In: -  Out: 800 [Urine:800]   Basename 06/29/11 0436  HGB 10.2*    Basename 06/29/11 0436  WBC 7.5  RBC 3.54*  HCT 29.6*  PLT 143*    Basename 06/29/11 0436  NA 130*  K 3.8  CL 95*  CO2 30  BUN 11  CREATININE 0.68  GLUCOSE 132*  CALCIUM 8.2*   No results found for this basename: LABPT:2,INR:2 in the last 72 hours  Sensation intact distally Intact pulses distally Incision: scant drainage Compartment soft  Assessment/Plan: 1 Day Post-Op Procedure(s) (LRB): TOTAL HIP ARTHROPLASTY ANTERIOR APPROACH (Left) Discharge home with home health in the next 2-3 days  Ricardo Anthony Y 06/29/2011, 1:43 PM

## 2011-06-29 NOTE — Progress Notes (Signed)
Physical Therapy Evaluation Patient Details Name: Ricardo Anthony MRN: [id number] DOB: Jul 17, 1951 Today's Date: 06/29/2011 0805 - 0837 EVAL Problem List:  Patient Active Problem List  Diagnoses  . Arthritis pain, hip    Past Medical History:  Past Medical History  Diagnosis Date  . Recurrent upper respiratory infection (URI)     states cold 2 weeks ago  . Diabetes mellitus     pre diabetes-diet controlled  . Arthritis     RHEUMATOID ARTHRITIS- diagnosed 6-7 yrs ago-  followed by  Dr   Lucillie Garfinkel in [place]  . Hypertension     states had stress test 3 years ago due to EKG shows previous MI, but denies- was told heart is "sitting abnormally and everything was OK"   Past Surgical History:  Past Surgical History  Procedure Date  . Joint replacement     right hip replacement  . Arthroscopy right knee   . Back surgery     x2 for ruptured disc- lumbar  . Tonsillectomy   . Repair fractured right heel     PT Assessment/Plan/Recommendation PT Assessment Clinical Impression Statement: Pt with Ant direct THR presents wtih decreased functional mobility and will benefit from skilled PT intervention to maximize Ind for D/C home PT Recommendation/Assessment: Patient will need skilled PT in the acute care venue PT Problem List: Decreased strength;Decreased range of motion;Decreased activity tolerance;Decreased mobility;Decreased knowledge of use of DME PT Plan PT Frequency: 7X/week PT Treatment/Interventions: DME instruction;Gait training;Stair training;Functional mobility training;Therapeutic exercise;Patient/family education PT Recommendation Follow Up Recommendations: Home health PT Equipment Recommended: None recommended by PT PT Goals  Acute Rehab PT Goals PT Goal Formulation: With patient Time For Goal Achievement: 7 days Pt will go Supine/Side to Sit: with supervision PT Goal: Supine/Side to Sit - Progress: Not met Pt will go Sit to Supine/Side: with supervision PT  Goal: Sit to Supine/Side - Progress: Not met Pt will Transfer Sit to Stand/Stand to Sit: with supervision PT Transfer Goal: Sit to Stand/Stand to Sit - Progress: Not met Pt will Ambulate: 51 - 150 feet;with supervision;with rolling walker PT Goal: Ambulate - Progress: Not met Pt will Go Up / Down Stairs: 3-5 stairs;with min assist;with least restrictive assistive device PT Goal: Up/Down Stairs - Progress: Not met  PT Evaluation Precautions/Restrictions  Precautions Precautions: Posterior Hip Restrictions Weight Bearing Restrictions: No LLE Weight Bearing: Weight bearing as tolerated Prior Functioning  Home Living Lives With: Spouse Receives Help From: Family Type of Home: House Home Layout: One level Home Access: Stairs to enter Entrance Stairs-Rails: None Entrance Stairs-Number of Steps: 3 Prior Function Level of Independence: Independent with basic ADLs;Independent with gait;Independent with transfers Able to Take Stairs?: Yes Cognition Cognition Arousal/Alertness: Awake/alert Overall Cognitive Status: Appears within functional limits for tasks assessed Orientation Level: Oriented X4 Sensation/Coordination Coordination Gross Motor Movements are Fluid and Coordinated: Yes Extremity Assessment RUE Assessment RUE Assessment: Within Functional Limits LUE Assessment LUE Assessment: Within Functional Limits RLE Assessment RLE Assessment: Within Functional Limits LLE Assessment LLE Assessment: Exceptions to WFL LLE AROM (degrees) Overall AROM Left Lower Extremity:  (90 flex, 20 abd) LLE Strength LLE Overall Strength:  (2+/5 hip, 3/5 quads) Mobility (including Balance) Bed Mobility Bed Mobility: Yes Supine to Sit: 4: Min assist Transfers Transfers: Yes Sit to Stand: 4: Min assist;3: Mod assist;From bed;With upper extremity assist Sit to Stand Details (indicate cue type and reason): cues for use of UES and LE postion Stand to Sit: 4: Min assist Stand to Sit Details:  cues for  use of UES and LE postion Ambulation/Gait Ambulation/Gait: Yes Ambulation/Gait Assistance: 4: Min assist Ambulation/Gait Assistance Details (indicate cue type and reason): cues for sequence, posture and positon from RW Ambulation Distance (Feet): 37 Feet Assistive device: Rolling walker Gait Pattern: Step-to pattern    Exercise  Total Joint Exercises Ankle Circles/Pumps: AROM;10 reps;Supine;Both Quad Sets: AROM;10 reps;Supine;Both Heel Slides: 10 reps;AAROM;Supine;Left Hip ABduction/ADduction: AAROM;10 reps;Supine;Left End of Session PT - End of Session Activity Tolerance: Patient tolerated treatment well Patient left: in chair;with call bell in reach Nurse Communication: Mobility status for transfers;Mobility status for ambulation General Behavior During Session: [facility] for tasks performed Cognition: [facility] for tasks performed  Braydn Carneiro 06/29/2011, 11:05 AM

## 2011-06-30 LAB — CBC
HCT: 29.1 % — ABNORMAL LOW (ref 39.0–52.0)
Hemoglobin: 9.9 g/dL — ABNORMAL LOW (ref 13.0–17.0)
MCH: 28.5 pg (ref 26.0–34.0)
MCV: 83.9 fL (ref 78.0–100.0)
RBC: 3.47 MIL/uL — ABNORMAL LOW (ref 4.22–5.81)

## 2011-06-30 NOTE — Plan of Care (Signed)
Problem: Consults Goal: Total Joint Replacement Patient Education See Patient Education Module for education specifics.  Outcome: Completed/Met Date Met:  06/30/11 Pre-op; also, this is patients' second hip replacement.

## 2011-06-30 NOTE — Plan of Care (Signed)
Problem: Discharge Progression Outcomes Goal: Anticoagulant follow-up in place Outcome: Completed/Met Date Met:  06/30/11 No monitoring needed patient on Xarelto

## 2011-06-30 NOTE — Progress Notes (Signed)
Physical Therapy Treatment Patient Details Name: Ricardo Anthony MRN: [id number] DOB: 1951-01-31 Today's Date: 06/30/2011 940-1005g PT Assessment/Plan  PT - Assessment/Plan PT Plan: Discharge plan remains appropriate PT Frequency: 7X/week Follow Up Recommendations: Home health PT Equipment Recommended: None recommended by PT PT Goals  Acute Rehab PT Goals PT Goal Formulation: With patient Time For Goal Achievement: 7 days Pt will go Supine/Side to Sit: with supervision PT Goal: Supine/Side to Sit - Progress: Progressing toward goal Pt will go Sit to Supine/Side: with supervision PT Goal: Sit to Supine/Side - Progress: Progressing toward goal Pt will Transfer Sit to Stand/Stand to Sit: with supervision PT Transfer Goal: Sit to Stand/Stand to Sit - Progress: Progressing toward goal Pt will Ambulate: 51 - 150 feet;with supervision;with rolling walker PT Goal: Ambulate - Progress: Progressing toward goal Pt will Go Up / Down Stairs: 3-5 stairs;with min assist;with least restrictive assistive device PT Goal: Up/Down Stairs - Progress: Progressing toward goal  PT Treatment Precautions/Restrictions  Restrictions Weight Bearing Restrictions: No LLE Weight Bearing: Weight bearing as tolerated Mobility (including Balance) Bed Mobility Bed Mobility: Yes Sit to Supine - Left: 4: Min assist Transfers Transfers: Yes Sit to Stand: 4: Min assist Sit to Stand Details (indicate cue type and reason): cues for ue and LLE Stand to Sit: 4: Min assist Stand to Sit Details: vc for UE Ambulation/Gait Ambulation/Gait: Yes Ambulation/Gait Assistance: 4: Min assist Ambulation/Gait Assistance Details (indicate cue type and reason):  (sequence) Ambulation Distance (Feet): 125 Feet Assistive device: Rolling walker Gait Pattern: Step-to pattern    Exercise  Total Joint Exercises Quad Sets: AROM;10 reps;Supine;left Heel Slides: AAROM;Left;10 reps;Supine Hip ABduction/ADduction:  AAROM;Left;10 reps;Supine shortarc quads 10 reps, left End of Session PT - End of Session Activity Tolerance: Patient tolerated treatment well Patient left: with call bell in reach;in bed Nurse Communication: Mobility status for transfers;Mobility status for ambulation General Behavior During Session: [facility] for tasks performed Cognition: [facility] for tasks performed  [name] 06/30/2011, 12:40 [id number]

## 2011-06-30 NOTE — Progress Notes (Signed)
1Physical Therapy Treatment Patient Details Name: Ricardo Anthony MRN: [id number] DOB: 10/28/1950 Today's Date: 06/30/2011 1310-1330 g PT Assessment/Plan  PT - Assessment/Plan PT Plan: Discharge plan remains appropriate PT Frequency: 7X/week Follow Up Recommendations: Home health PT Equipment Recommended: None recommended by PT PT Goals  Acute Rehab PT Goals PT Goal Formulation: With patient Time For Goal Achievement: 7 days Pt will go Supine/Side to Sit: with supervision Pt will go Sit to Supine/Side: with supervision Pt will Transfer Sit to Stand/Stand to Sit: with supervision PT Transfer Goal: Sit to Stand/Stand to Sit - Progress: Progressing toward goal Pt will Ambulate: 51 - 150 feet;with supervision;with rolling walker PT Goal: Ambulate - Progress: Progressing toward goal Pt will Go Up / Down Stairs: 3-5 stairs;with min assist;with least restrictive assistive device PT Goal: Up/Down Stairs - Progress: Progressing toward goal  PT Treatment Precautions/Restrictions  Restrictions Weight Bearing Restrictions: No LLE Weight Bearing: Weight bearing as tolerated Mobility (including Balance) Transfers Transfers: Yes Sit to Stand: 4: Min assist Stand to Sit: 4: Min assist Ambulation/Gait Ambulation/Gait: Yes Ambulation/Gait Assistance: 4: Min assist Ambulation Distance (Feet): 125 Feet Assistive device: Rolling walker Gait Pattern: Step-to pattern    Exercise    End of Session PT - End of Session Activity Tolerance: Patient tolerated treatment well Patient left: with call bell in reach;in bed Nurse Communication: Mobility status for transfers;Mobility status for ambulation General Behavior During Session: [facility] for tasks performed Cognition: [facility] for tasks performed  [name] 06/30/2011, 1:51 PM

## 2011-06-30 NOTE — Progress Notes (Signed)
Subjective: 2 Days Post-Op Procedure(s) (LRB): TOTAL HIP ARTHROPLASTY ANTERIOR APPROACH (Left) Patient reports pain as moderate.  Doing well with PT.  Objective: Vital signs in last 24 hours: Temp:  [99 F (37.2 C)-101 F (38.3 C)] 99 F (37.2 C) (11/25 0505) Pulse Rate:  [87-110] 89  (11/25 1009) Resp:  [16-20] 16  (11/25 0815) BP: (119-161)/(67-80) 139/80 mmHg (11/25 1009) SpO2:  [92 %-100 %] 94 % (11/25 1009)  Intake/Output from previous day: 11/24 0701 - 11/25 0700 In: 1875.8 [P.O.:720; I.V.:1155.8] Out: 2700 [Urine:2700] Intake/Output this shift:     Basename 06/30/11 0507 06/29/11 0436  HGB 9.9* 10.2*    Basename 06/30/11 0507 06/29/11 0436  WBC 6.9 7.5  RBC 3.47* 3.54*  HCT 29.1* 29.6*  PLT 135* 143*    Basename 06/29/11 0436  NA 130*  K 3.8  CL 95*  CO2 30  BUN 11  CREATININE 0.68  GLUCOSE 132*  CALCIUM 8.2*   No results found for this basename: LABPT:2,INR:2 in the last 72 hours  Sensation intact distally Intact pulses distally Dorsiflexion/Plantar flexion intact Incision: scant drainage  Assessment/Plan: 2 Days Post-Op Procedure(s) (LRB): TOTAL HIP ARTHROPLASTY ANTERIOR APPROACH (Left) Plan for discharge tomorrow  Kathryne Hitch 06/30/2011, 10:38 AM

## 2011-07-01 LAB — CBC
HCT: 27.2 % — ABNORMAL LOW (ref 39.0–52.0)
MCH: 28.3 pg (ref 26.0–34.0)
MCHC: 33.5 g/dL (ref 30.0–36.0)
MCV: 84.7 fL (ref 78.0–100.0)
RDW: 13.9 % (ref 11.5–15.5)
WBC: 6.4 10*3/uL (ref 4.0–10.5)

## 2011-07-01 MED ORDER — OXYCODONE-ACETAMINOPHEN 5-325 MG PO TABS: 1.0000 | ORAL_TABLET | ORAL | Status: AC | PRN

## 2011-07-01 MED ORDER — METHOCARBAMOL 500 MG PO TABS: 500.0000 mg | ORAL_TABLET | Freq: Four times a day (QID) | ORAL | Status: AC | PRN

## 2011-07-01 MED ORDER — RIVAROXABAN 10 MG PO TABS: 10.0000 mg | ORAL_TABLET | Freq: Every day | ORAL | Status: AC

## 2011-07-01 NOTE — Progress Notes (Signed)
CM consult done. See CM notes in shadow chart.  Kariann Wecker Wyche RN BSN CCM [phone] 07/01/2011    

## 2011-07-01 NOTE — Progress Notes (Signed)
Physical Therapy Treatment Patient Details Name: Alistair Senft MRN: [id number] DOB: [birth year] Today's Date: 07/01/2011 0950-1002 1gt PT Assessment/Plan  PT - Assessment/Plan Comments on Treatment Session: pt anxious to d/c home today PT Plan: Discharge plan remains appropriate Follow Up Recommendations: Home health PT Equipment Recommended: None recommended by PT PT Goals  Acute Rehab PT Goals PT Transfer Goal: Sit to Stand/Stand to Sit - Progress: Met Pt will Ambulate: 51 - 150 feet;with supervision;with rolling walker PT Goal: Ambulate - Progress: Met Pt will Go Up / Down Stairs: 3-5 stairs;with min assist;with least restrictive assistive device PT Goal: Up/Down Stairs - Progress: Met  PT Treatment Precautions/Restrictions  Restrictions Weight Bearing Restrictions: No LLE Weight Bearing: Weight bearing as tolerated Mobility (including Balance) Transfers Sit to Stand: 6: Modified independent (Device/Increase time) Stand to Sit: 6: Modified independent (Device/Increase time);To chair/3-in-1;With upper extremity assist;With armrests Ambulation/Gait Ambulation/Gait Assistance: 5: Supervision Ambulation/Gait Assistance Details (indicate cue type and reason): cues for sequence, posture and positon from RW Ambulation Distance (Feet): 140 Feet Assistive device: Rolling walker Gait Pattern: Step-to pattern Stairs: Yes Stairs Assistance: 4: Min assist Stairs Assistance Details (indicate cue type and reason): verbal cues for sequence Stair Management Technique: No rails;With walker;Backwards Number of Stairs: 3     Exercise    End of Session PT - End of Session Activity Tolerance: Patient tolerated treatment well Patient left: in chair;with call bell in reach;with family/visitor present General Behavior During Session: [facility] for tasks performed Cognition: [facility] for tasks performed  [facility] 07/01/2011, 11:02 AM

## 2011-07-01 NOTE — Discharge Summary (Signed)
Physician Discharge Summary  Patient ID: Ricardo Anthony MRN: [id number] DOB/AGE: November 09, 1950 60 y.o.  Admit date: 06/28/2011 Discharge date: 07/01/2011  Admission Diagnoses:  <principal problem not specified>  Discharge Diagnoses:  Active Problems:  Arthritis pain, hip   Past Medical History  Diagnosis Date  . Recurrent upper respiratory infection (URI)     states cold 2 weeks ago  . Diabetes mellitus     pre diabetes-diet controlled  . Arthritis     RHEUMATOID ARTHRITIS- diagnosed 6-7 yrs ago-  followed by  Dr   Lucillie Garfinkel in [place]  . Hypertension     states had stress test 3 years ago due to EKG shows previous MI, but denies- was told heart is "sitting abnormally and everything was OK"    Surgeries: Procedure(s): TOTAL HIP ARTHROPLASTY ANTERIOR APPROACH on 06/28/2011   Consultants (if any):    Discharged Condition: Improved  Hospital Course: Ricardo Anthony is an 60 y.o. male who was admitted 06/28/2011 with a diagnosis of left hip arthritis and went to the operating room on 06/28/2011 and underwent the above named procedures.    He was given perioperative antibiotics:  Anti-infectives     Start     Dose/Rate Route Frequency Ordered Stop   06/28/11 1700   hydroxychloroquine (PLAQUENIL) tablet 200 mg        200 mg Oral Every evening 06/28/11 1538     06/28/11 1500   clindamycin (CLEOCIN) IVPB 600 mg        600 mg 100 mL/hr over 30 Minutes Intravenous Every 6 hours 06/28/11 1355 06/29/11 0516   06/28/11 0900   clindamycin (CLEOCIN) IVPB 600 mg        600 mg 100 mL/hr over 30 Minutes Intravenous 60 min pre-op 06/28/11 0838 06/28/11 1109        .  He was given sequential compression devices, early ambulation, and chemoprophylaxis for DVT prophylaxis.  They benefited maximally from their hospital stay and there were no complications.    Recent vital signs:  Filed Vitals:   07/01/11 0615  BP: 137/80  Pulse: 77  Temp: 99 F (37.2 C)    Resp: 20    Recent laboratory studies:  Lab Results  Component Value Date   HGB 9.1* 07/01/2011   HGB 9.9* 06/30/2011   HGB 10.2* 06/29/2011   Lab Results  Component Value Date   WBC 6.4 07/01/2011   PLT 142* 07/01/2011   Lab Results  Component Value Date   INR 0.98 06/21/2011   Lab Results  Component Value Date   NA 130* 06/29/2011   K 3.8 06/29/2011   CL 95* 06/29/2011   CO2 30 06/29/2011   BUN 11 06/29/2011   CREATININE 0.68 06/29/2011   GLUCOSE 132* 06/29/2011    Discharge Medications:   Current Discharge Medication List    START taking these medications   Details  methocarbamol (ROBAXIN) 500 MG tablet Take 1 tablet (500 mg total) by mouth every 6 (six) hours as needed. Qty: 60 tablet, Refills: 0    oxyCODONE-acetaminophen (ROXICET) 5-325 MG per tablet Take 1-2 tablets by mouth every 4 (four) hours as needed for pain. Qty: 60 tablet, Refills: 0    rivaroxaban (XARELTO) 10 MG TABS tablet Take 1 tablet (10 mg total) by mouth daily. Qty: 20 tablet, Refills: 0      CONTINUE these medications which have NOT CHANGED   Details  amLODipine (NORVASC) 10 MG tablet Take 10 mg by mouth every morning.  folic acid (FOLVITE) 1 MG tablet Take 1 mg by mouth daily.     hydroxychloroquine (PLAQUENIL) 200 MG tablet Take 200 mg by mouth every evening.     leflunomide (ARAVA) 10 MG tablet Take 10 mg by mouth every evening.      quinapril-hydrochlorothiazide (ACCURETIC) 20-12.5 MG per tablet Take 1 tablet by mouth 2 (two) times daily.     adalimumab (HUMIRA) 40 MG/0.8ML injection Inject 40 mg into the skin every 14 (fourteen) days.     aspirin EC 81 MG tablet Take 81 mg by mouth every morning.       STOP taking these medications     traMADol (ULTRAM) 50 MG tablet         Diagnostic Studies: Ap Pelvis Hip  06/28/2011  *RADIOLOGY REPORT*  Clinical Data: Hip replacement.  PELVIS - 1-2 VIEW  Comparison: Single view of the pelvis 03/15/2011.  Findings: The patient  has a new left total arthroplasty.  The device is located.  No fracture is seen.  There is some gas in the soft tissues from surgery.  Right total hip replacement again noted.  IMPRESSION: New left total hip without evidence of complication.  Original Report Authenticated By: Bernadene Bell. D'ALESSIO, M.D.   Dg Hip 1 View Left  06/28/2011  *RADIOLOGY REPORT*  Clinical Data: Hip replacement.  LEFT HIP - 1 VIEW:  Comparison: Plain film the pelvis 03/15/2011.  Findings: The patient has a new left total hip replacement. The device is located.  No fracture is identified.  Surgical staples and gas in the soft tissues noted.  IMPRESSION: New left total hip without evidence of complication.  Original Report Authenticated By: Bernadene Bell. D'ALESSIO, M.D.   Dg Hip Complete Left  06/28/2011  *RADIOLOGY REPORT*  Clinical Data: left anterior hip replacement  LEFT HIP - COMPLETE 2+ VIEW  Comparison: 03/15/2011  Findings: A left hip arthroplasty has been performed.  The hardware components are in anatomic alignment and no periprosthetic fracture is identified.  IMPRESSION:  1.  No complications after left hip arthroplasty.  Original Report Authenticated By: Rosealee Albee, M.D.   Dg C-arm 58-120 Min-no Report  06/28/2011  CLINICAL DATA: anterior hip replacement   C-ARM 61-120 MINUTES  Fluoroscopy was utilized by the requesting physician.  No radiographic  interpretation.      Disposition: Home-Health Care Svc  Discharge Orders    Future Orders Please Complete By Expires   Diet - low sodium heart healthy      Call MD / Call 911      Comments:   If you experience chest pain or shortness of breath, CALL 911 and be transported to the hospital emergency room.  If you develope a fever above 101 F, pus (white drainage) or increased drainage or redness at the wound, or calf pain, call your surgeon's office.   Constipation Prevention      Comments:   Drink plenty of fluids.  Prune juice may be helpful.  You may use a  stool softener, such as Colace (over the counter) 100 mg twice a day.  Use MiraLax (over the counter) for constipation as needed.   Increase activity slowly as tolerated      Weight Bearing as taught in Physical Therapy      Comments:   Use a walker or crutches as instructed.   Follow the hip precautions as taught in Physical Therapy      Change dressing      Comments:   You  may change your dressing on 11/28, then change the dressing daily with sterile 4 x 4 inch gauze dressing and paper tape or big band-aids.  You can get your actual incision wet in the shower starting 11/28.         SignedKathryne Hitch 07/01/2011, 7:05 AM

## 2011-07-02 ENCOUNTER — Encounter (HOSPITAL_COMMUNITY): Payer: Self-pay | Admitting: Orthopaedic Surgery

## 2011-10-17 IMAGING — RF DG HIP COMPLETE 2+V*R*
1 series · 2 of 2 positions shown · non-contrast
Comparison: None.

CLINICAL DATA: Right hip replacement.

RIGHT HIP - COMPLETE 2+ VIEW

[Series 1: run · 2 of 2 slices shown]
[im 1/2]
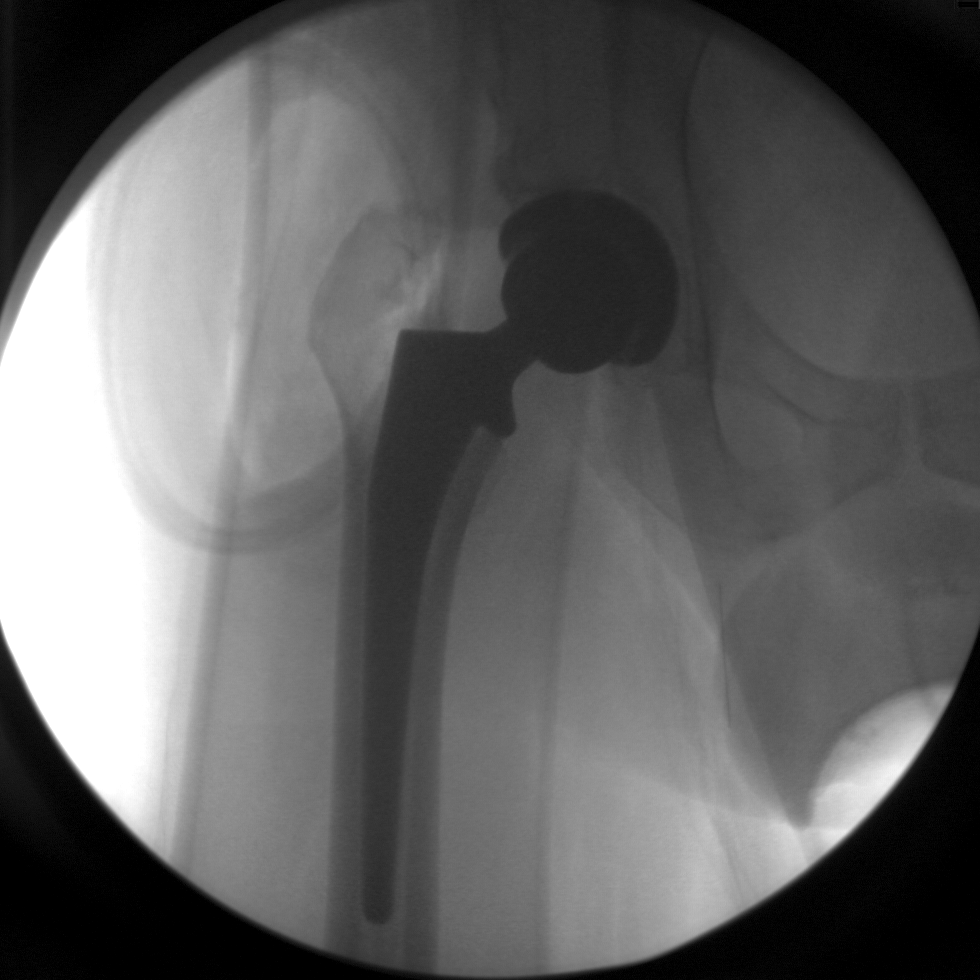
[im 2/2]
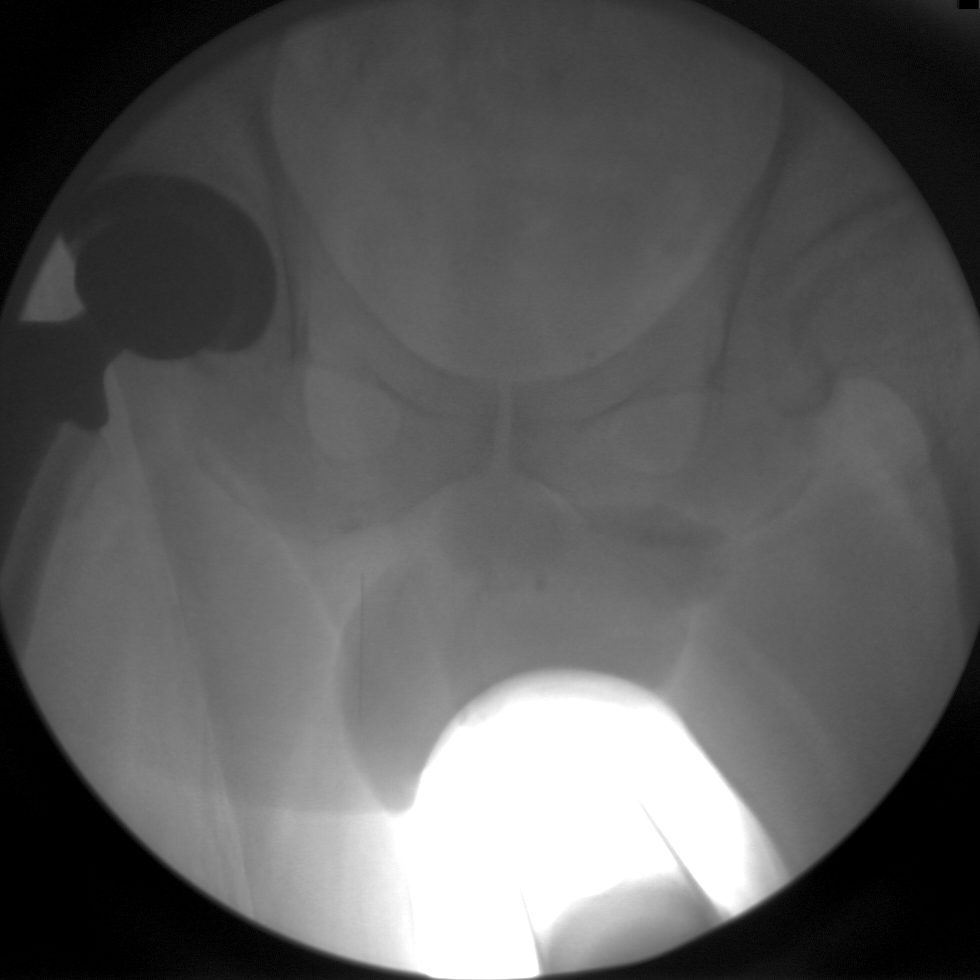

[2 of 2 positions shown; findings below may reference images not displayed]

FINDINGS: Two intraoperative views.  These demonstrate placement of
a right hip arthroplasty. No acute complication or periprosthetic
fracture.
IMPRESSION: Intraoperative imaging of right hip arthroplasty.

## 2012-01-30 IMAGING — RF DG HIP (WITH OR WITHOUT PELVIS) 2-3V*L*
1 series · 2 of 2 positions shown · non-contrast
Comparison: 03/15/2011

CLINICAL DATA: left anterior hip replacement

LEFT HIP - COMPLETE 2+ VIEW

[Series 1: run · 2 of 2 slices shown]
[im 1/2]
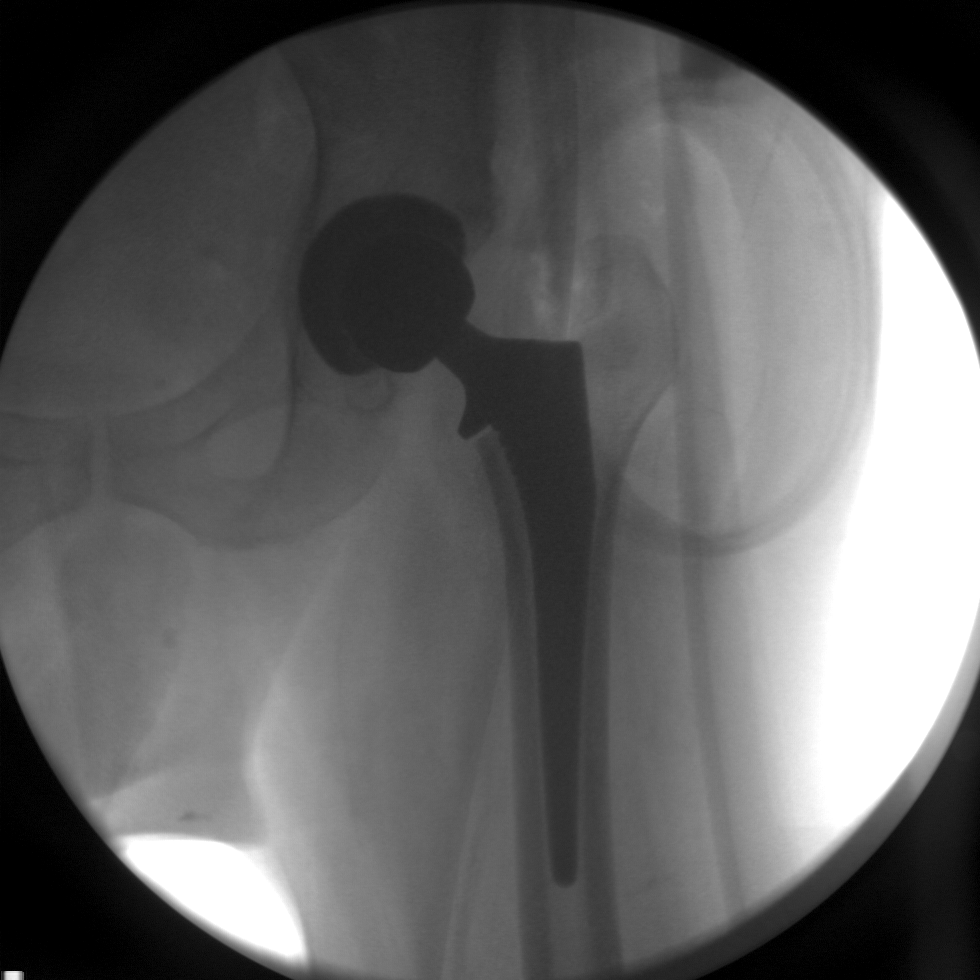
[im 2/2]
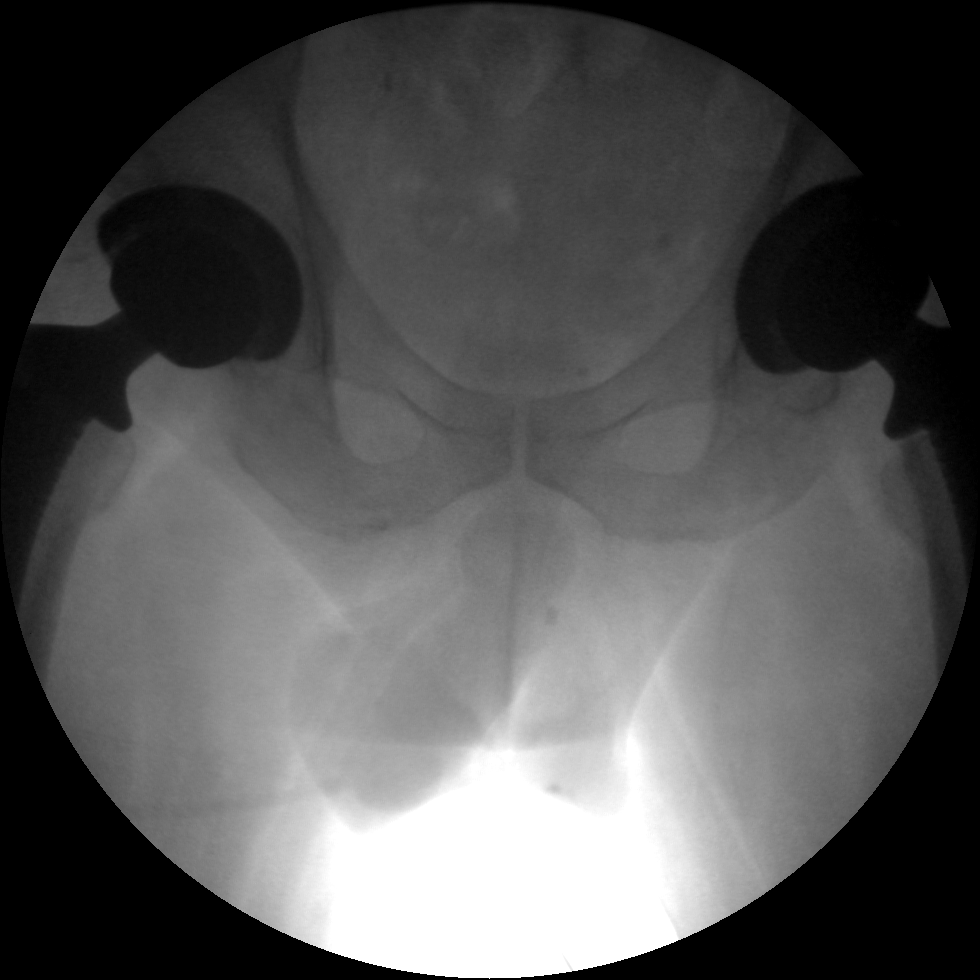

[2 of 2 positions shown; findings below may reference images not displayed]

FINDINGS: A left hip arthroplasty has been performed.

The hardware components are in anatomic alignment and no
periprosthetic fracture is identified.
IMPRESSION: 1.  No complications after left hip arthroplasty.

## 2015-11-09 DIAGNOSIS — D126 Benign neoplasm of colon, unspecified: Secondary | ICD-10-CM | POA: Insufficient documentation

## 2015-11-09 DIAGNOSIS — E119 Type 2 diabetes mellitus without complications: Secondary | ICD-10-CM | POA: Insufficient documentation

## 2015-11-09 DIAGNOSIS — G5600 Carpal tunnel syndrome, unspecified upper limb: Secondary | ICD-10-CM | POA: Insufficient documentation

## 2015-11-09 DIAGNOSIS — R21 Rash and other nonspecific skin eruption: Secondary | ICD-10-CM | POA: Insufficient documentation

## 2015-11-09 DIAGNOSIS — J0100 Acute maxillary sinusitis, unspecified: Secondary | ICD-10-CM | POA: Insufficient documentation

## 2015-11-09 DIAGNOSIS — M0579 Rheumatoid arthritis with rheumatoid factor of multiple sites without organ or systems involvement: Secondary | ICD-10-CM | POA: Insufficient documentation

## 2015-11-09 DIAGNOSIS — D696 Thrombocytopenia, unspecified: Secondary | ICD-10-CM | POA: Insufficient documentation

## 2015-11-09 DIAGNOSIS — R609 Edema, unspecified: Secondary | ICD-10-CM | POA: Insufficient documentation

## 2015-11-09 DIAGNOSIS — E785 Hyperlipidemia, unspecified: Secondary | ICD-10-CM | POA: Insufficient documentation

## 2015-11-09 DIAGNOSIS — I10 Essential (primary) hypertension: Secondary | ICD-10-CM | POA: Insufficient documentation

## 2015-11-09 DIAGNOSIS — M545 Low back pain, unspecified: Secondary | ICD-10-CM | POA: Insufficient documentation

## 2015-11-10 DIAGNOSIS — Z79899 Other long term (current) drug therapy: Secondary | ICD-10-CM | POA: Insufficient documentation

## 2017-12-12 DIAGNOSIS — M2550 Pain in unspecified joint: Secondary | ICD-10-CM | POA: Insufficient documentation

## 2019-09-08 ENCOUNTER — Ambulatory Visit: Payer: Self-pay | Admitting: Sports Medicine

## 2019-09-08 ENCOUNTER — Other Ambulatory Visit: Payer: Self-pay | Admitting: Sports Medicine

## 2019-09-08 DIAGNOSIS — M79671 Pain in right foot: Secondary | ICD-10-CM

## 2019-09-08 DIAGNOSIS — M79672 Pain in left foot: Secondary | ICD-10-CM

## 2019-09-22 ENCOUNTER — Other Ambulatory Visit: Payer: Self-pay

## 2019-09-22 ENCOUNTER — Ambulatory Visit: Payer: Medicare PPO | Admitting: Sports Medicine

## 2019-09-22 ENCOUNTER — Other Ambulatory Visit: Payer: Self-pay | Admitting: Sports Medicine

## 2019-09-22 ENCOUNTER — Encounter: Payer: Self-pay | Admitting: Sports Medicine

## 2019-09-22 ENCOUNTER — Ambulatory Visit (INDEPENDENT_AMBULATORY_CARE_PROVIDER_SITE_OTHER): Payer: Medicare PPO

## 2019-09-22 DIAGNOSIS — M7751 Other enthesopathy of right foot: Secondary | ICD-10-CM | POA: Diagnosis not present

## 2019-09-22 DIAGNOSIS — M2550 Pain in unspecified joint: Secondary | ICD-10-CM | POA: Diagnosis not present

## 2019-09-22 DIAGNOSIS — M0579 Rheumatoid arthritis with rheumatoid factor of multiple sites without organ or systems involvement: Secondary | ICD-10-CM | POA: Diagnosis not present

## 2019-09-22 DIAGNOSIS — E119 Type 2 diabetes mellitus without complications: Secondary | ICD-10-CM | POA: Diagnosis not present

## 2019-09-22 DIAGNOSIS — M19079 Primary osteoarthritis, unspecified ankle and foot: Secondary | ICD-10-CM

## 2019-09-22 DIAGNOSIS — M79672 Pain in left foot: Secondary | ICD-10-CM

## 2019-09-22 DIAGNOSIS — M19072 Primary osteoarthritis, left ankle and foot: Secondary | ICD-10-CM

## 2019-09-22 DIAGNOSIS — M79671 Pain in right foot: Secondary | ICD-10-CM

## 2019-09-22 DIAGNOSIS — M779 Enthesopathy, unspecified: Secondary | ICD-10-CM

## 2019-09-22 MED ORDER — TRIAMCINOLONE ACETONIDE 10 MG/ML IJ SUSP
10.0000 mg | Freq: Once | INTRAMUSCULAR | Status: AC
  Administered 2019-09-22: 21:00:00 10 mg

## 2019-09-22 NOTE — Progress Notes (Signed)
Subjective: Agamjot Mazzara is a 69 y.o. male patient who presents to office for evaluation of right greater than left foot pain. Patient complains of progressive pain especially over the last year in the toe joints 1 through 5 with stiffness. Ranks pain 2-8/10 and is now interferring with daily activities. Patient has tried changing shoes and tramadol with no relief in symptoms.  Admits a history of breaking his left heel and still has pain in that area as well.  Patient denies any other pedal complaints. Denies injury/trip/fall/sprain/any causative factors.  Reports that he has a history of rheumatoid arthritis as well as a history of diabetes blood sugar today not recorded.  Review of Systems  All other systems reviewed and are negative.   Patient Active Problem List   Diagnosis Date Noted  . Pain in joint involving multiple sites 12/12/2017  . Drug therapy 11/10/2015  . Acute maxillary sinusitis 11/09/2015  . Benign neoplasm of colon 11/09/2015  . Carpal tunnel syndrome 11/09/2015  . Edema 11/09/2015  . Hyperlipemia 11/09/2015  . Hypertension 11/09/2015  . Lower back pain 11/09/2015  . Rash 11/09/2015  . Rheumatoid arthritis involving multiple sites with positive rheumatoid factor ([place]) 11/09/2015  . Thrombocytopenia ([place]) 11/09/2015  . Type 2 diabetes mellitus ([place]) 11/09/2015  . Arthritis pain, hip 06/28/2011    Current Outpatient Medications on File Prior to Visit  Medication Sig Dispense Refill  . adalimumab (HUMIRA) 40 MG/0.8ML injection Inject 40 mg into the skin every 14 (fourteen) days.     Marland Kitchen amLODipine (NORVASC) 10 MG tablet Take 10 mg by mouth every morning.     Marland Kitchen aspirin EC 81 MG tablet Take 81 mg by mouth every morning.     . folic acid (FOLVITE) 1 MG tablet Take 1 mg by mouth daily.     . hydroxychloroquine (PLAQUENIL) 200 MG tablet Take 200 mg by mouth every evening.     . leflunomide (ARAVA) 10 MG tablet Take 10 mg by mouth every evening.      .  quinapril-hydrochlorothiazide (ACCURETIC) 20-12.5 MG per tablet Take 1 tablet by mouth 2 (two) times daily.     . rivaroxaban (XARELTO) 10 MG TABS tablet Take 1 tablet (10 mg total) by mouth daily. 20 tablet 0   No current facility-administered medications on file prior to visit.    Allergies  Allergen Reactions  . Other Anaphylaxis  . Penicillins Anaphylaxis    Objective:  General: Alert and oriented x3 in no acute distress  Dermatology: No open lesions bilateral lower extremities, no webspace macerations, no ecchymosis bilateral, all nails x 10 are well manicured.  Vascular: Dorsalis Pedis and Posterior Tibial pedal pulses palpable, 1 out of 4, Capillary Fill Time 3 seconds, scant pedal hair growth bilateral, trace edema bilateral lower extremities, Temperature gradient within normal limits.  Neurology: Johney Maine sensation intact via light touch bilateral, Protective sensation slightly diminished bilateral.  Musculoskeletal: Mild tenderness with palpation first through fifth metatarsophalangeal joints with the most pain at the second metatarsophalangeal joint upon palpation, there is significant dorsal bone spurs over the first metatarsophalangeal joint right greater than left and dorsal midfoot left greater than right, there is significant limitation of range of motion of all toe joints with most limitation noted at hallux bilateral, strength within normal limits in all groups bilateral.   Gait: Antalgic gait  Xrays  Left and right foot   Impression: Severe end-stage arthritis first metatarsophalangeal joint, there is mild arthritis at the lesser metatarsophalangeal joints, there is  significant arthritis dorsal midfoot left greater than right and ankles consistent with rheumatoid history of diffuse arthritis bilateral.  Assessment and Plan: Problem List Items Addressed This Visit      Musculoskeletal and Integument   Rheumatoid arthritis involving multiple sites with positive  rheumatoid factor (HCC)   Relevant Medications   triamcinolone acetonide (KENALOG) 10 MG/ML injection 10 mg     Other   Pain in joint involving multiple sites    Other Visit Diagnoses    Capsulitis    -  Primary   Relevant Medications   triamcinolone acetonide (KENALOG) 10 MG/ML injection 10 mg   Diabetes mellitus without complication (HCC)       Arthritis of foot       Relevant Medications   triamcinolone acetonide (KENALOG) 10 MG/ML injection 10 mg   Foot pain, bilateral           -Complete examination performed -Xrays reviewed -Discussed treatment options for foot pain capsulitis and arthritis -After oral consent and aseptic prep, injected a mixture containing 1 ml of 2%  plain lidocaine, 1 ml 0.5% plain marcaine, 0.5 ml of kenalog 10 and 0.5 ml of dexamethasone phosphate into The right second toe joint at the level of the metatarsophalangeal joint at the area of maximal tenderness without complication. Post-injection care discussed with patient.  -Dispensed bilateral silicone metatarsal sleeves to help offload the balls of both feet -Recommend good supportive shoes rest ice elevation and to limit activities that may aggravate his lesser and great toe joints -Advised patient that surgery at this time is a high risk option and would only recommend this as an option of last resort if he is not making any improvement with conservative measures -Patient to return to office in 3 weeks or sooner if condition worsens.  Landis Martins, DPM

## 2019-10-04 ENCOUNTER — Other Ambulatory Visit: Payer: Self-pay | Admitting: Sports Medicine

## 2019-10-04 DIAGNOSIS — M19079 Primary osteoarthritis, unspecified ankle and foot: Secondary | ICD-10-CM

## 2019-10-04 DIAGNOSIS — M779 Enthesopathy, unspecified: Secondary | ICD-10-CM

## 2019-10-14 ENCOUNTER — Other Ambulatory Visit: Payer: Self-pay

## 2019-10-14 ENCOUNTER — Encounter: Payer: Self-pay | Admitting: Sports Medicine

## 2019-10-14 ENCOUNTER — Ambulatory Visit: Payer: Medicare PPO | Admitting: Sports Medicine

## 2019-10-14 DIAGNOSIS — E119 Type 2 diabetes mellitus without complications: Secondary | ICD-10-CM

## 2019-10-14 DIAGNOSIS — M0579 Rheumatoid arthritis with rheumatoid factor of multiple sites without organ or systems involvement: Secondary | ICD-10-CM | POA: Diagnosis not present

## 2019-10-14 DIAGNOSIS — M2550 Pain in unspecified joint: Secondary | ICD-10-CM | POA: Diagnosis not present

## 2019-10-14 DIAGNOSIS — M779 Enthesopathy, unspecified: Secondary | ICD-10-CM

## 2019-10-14 DIAGNOSIS — M79671 Pain in right foot: Secondary | ICD-10-CM

## 2019-10-14 DIAGNOSIS — M79672 Pain in left foot: Secondary | ICD-10-CM

## 2019-10-14 DIAGNOSIS — M19079 Primary osteoarthritis, unspecified ankle and foot: Secondary | ICD-10-CM

## 2019-10-14 NOTE — Patient Instructions (Addendum)
Recommend using Voltaren topical cream for pain in feet  Recommend continuing with metatarsal pad/cushions until you can be fitted with diabetic shoes and insoles  You have arthritis and diabetes which is likely the cause for your pain in your feet  Recommend you continue with gabapentin and discuss with your primary doctor about increasing the dose if it is not helping with your foot pain  Return to office in 2 weeks to be measured for custom insoles and diabetic shoes  Refrain from walking barefoot

## 2019-10-14 NOTE — Progress Notes (Signed)
Subjective: Ricardo Anthony is a 69 y.o. male patient who returns office for follow-up evaluation of bilateral foot pain.  Patient received injection in the right ball last visit and reports it helped with the stiffness but still has pain worse when he takes shoes off and attempts to walk barefoot. Padding helps but some days the pain is so bad can hardly walk. Patient denies any other pedal complaints.   Fasting blood sugar not recorded    Patient Active Problem List   Diagnosis Date Noted  . Pain in joint involving multiple sites 12/12/2017  . Drug therapy 11/10/2015  . Acute maxillary sinusitis 11/09/2015  . Benign neoplasm of colon 11/09/2015  . Carpal tunnel syndrome 11/09/2015  . Edema 11/09/2015  . Hyperlipemia 11/09/2015  . Hypertension 11/09/2015  . Lower back pain 11/09/2015  . Rash 11/09/2015  . Rheumatoid arthritis involving multiple sites with positive rheumatoid factor ([place]) 11/09/2015  . Thrombocytopenia ([place]) 11/09/2015  . Type 2 diabetes mellitus ([place]) 11/09/2015  . Arthritis pain, hip 06/28/2011    Current Outpatient Medications on File Prior to Visit  Medication Sig Dispense Refill  . adalimumab (HUMIRA) 40 MG/0.8ML injection Inject 40 mg into the skin every 14 (fourteen) days.     Marland Kitchen amLODipine (NORVASC) 10 MG tablet Take 10 mg by mouth every morning.     Marland Kitchen amoxicillin (AMOXIL) 500 MG capsule     . aspirin EC 81 MG tablet Take 81 mg by mouth every morning.     Marland Kitchen atorvastatin (LIPITOR) 80 MG tablet     . empagliflozin (JARDIANCE) 25 MG TABS tablet Take by mouth.    . escitalopram (LEXAPRO) 10 MG tablet Take by mouth.    . folic acid (FOLVITE) 1 MG tablet Take 1 mg by mouth daily.     Marland Kitchen gabapentin (NEURONTIN) 300 MG capsule     . hydroxychloroquine (PLAQUENIL) 200 MG tablet Take 200 mg by mouth every evening.     . leflunomide (ARAVA) 10 MG tablet Take 10 mg by mouth every evening.      . methotrexate (RHEUMATREX) 2.5 MG tablet     . metoprolol succinate  (TOPROL-XL) 50 MG 24 hr tablet Take by mouth.    . quinapril-hydrochlorothiazide (ACCURETIC) 20-12.5 MG per tablet Take 1 tablet by mouth 2 (two) times daily.     . rivaroxaban (XARELTO) 10 MG TABS tablet Take 1 tablet (10 mg total) by mouth daily. 20 tablet 0  . sulfaSALAzine (AZULFIDINE) 500 MG tablet Take by mouth.    . valACYclovir (VALTREX) 1000 MG tablet      No current facility-administered medications on file prior to visit.    Allergies  Allergen Reactions  . Other Anaphylaxis  . Penicillins Anaphylaxis    Objective:  General: Alert and oriented x3 in no acute distress  Dermatology: No open lesions bilateral lower extremities, no webspace macerations, no ecchymosis bilateral, all nails x 10 are well manicured.  Vascular: Dorsalis Pedis and Posterior Tibial pedal pulses palpable, 1 out of 4, Capillary Fill Time 3 seconds, scant pedal hair growth bilateral, trace edema bilateral lower extremities, Temperature gradient within normal limits.  Neurology: Johney Maine sensation intact via light touch bilateral, Protective sensation slightly diminished bilateral.  Musculoskeletal: Mild tenderness with palpation to ball of foot R=L, significant dorsal bone spurs over the first metatarsophalangeal joint right greater than left and dorsal midfoot left greater than right, there is significant limitation of range of motion of all toe joints with most limitation noted at hallux  bilateral, strength within normal limits in all groups bilateral.   Assessment and Plan: Problem List Items Addressed This Visit      Musculoskeletal and Integument   Rheumatoid arthritis involving multiple sites with positive rheumatoid factor (HCC)     Other   Pain in joint involving multiple sites    Other Visit Diagnoses    Capsulitis    -  Primary   Diabetes mellitus without complication (HCC)       Arthritis of foot       Foot pain, bilateral           -Complete examination performed -Discussed with  patient that due to his arthritis (RA) and diabetes he will always have pain in his feet -Advised patient to refrain from walking barefoot -Recommend diabetic shoes and custom insoles but for now continue with met pads -Recommend OTC pain cream as needed -Continue with Gabapentin and discuss with PCP increasing dose if not effective -Return to office as scheduled or sooner if fails to continue to improve. Landis Martins, DPM

## 2019-11-17 ENCOUNTER — Ambulatory Visit: Payer: Medicare PPO | Admitting: Orthotics

## 2019-11-17 ENCOUNTER — Other Ambulatory Visit: Payer: Self-pay

## 2019-11-17 DIAGNOSIS — M19079 Primary osteoarthritis, unspecified ankle and foot: Secondary | ICD-10-CM

## 2019-11-17 DIAGNOSIS — E119 Type 2 diabetes mellitus without complications: Secondary | ICD-10-CM

## 2019-11-17 NOTE — Progress Notes (Signed)

## 2020-02-08 ENCOUNTER — Other Ambulatory Visit: Payer: Medicare PPO | Admitting: Orthotics

## 2020-02-08 ENCOUNTER — Other Ambulatory Visit: Payer: Self-pay

## 2020-02-08 DIAGNOSIS — M216X2 Other acquired deformities of left foot: Secondary | ICD-10-CM | POA: Diagnosis not present

## 2020-02-08 DIAGNOSIS — E119 Type 2 diabetes mellitus without complications: Secondary | ICD-10-CM | POA: Diagnosis not present

## 2020-02-08 DIAGNOSIS — M216X1 Other acquired deformities of right foot: Secondary | ICD-10-CM | POA: Diagnosis not present

## 2022-05-28 ENCOUNTER — Ambulatory Visit (INDEPENDENT_AMBULATORY_CARE_PROVIDER_SITE_OTHER): Payer: Medicare HMO

## 2022-05-28 ENCOUNTER — Ambulatory Visit (INDEPENDENT_AMBULATORY_CARE_PROVIDER_SITE_OTHER): Payer: Medicare HMO | Admitting: Orthopaedic Surgery

## 2022-05-28 DIAGNOSIS — M7061 Trochanteric bursitis, right hip: Secondary | ICD-10-CM | POA: Diagnosis not present

## 2022-05-28 DIAGNOSIS — M25551 Pain in right hip: Secondary | ICD-10-CM

## 2022-05-28 DIAGNOSIS — Z96641 Presence of right artificial hip joint: Secondary | ICD-10-CM | POA: Diagnosis not present

## 2022-05-28 DIAGNOSIS — Z96642 Presence of left artificial hip joint: Secondary | ICD-10-CM

## 2022-05-28 MED ORDER — LIDOCAINE HCL 1 % IJ SOLN
3.0000 mL | INTRAMUSCULAR | Status: AC | PRN
  Administered 2022-05-28: 3 mL

## 2022-05-28 MED ORDER — METHYLPREDNISOLONE ACETATE 40 MG/ML IJ SUSP
40.0000 mg | INTRAMUSCULAR | Status: AC | PRN
  Administered 2022-05-28: 40 mg via INTRA_ARTICULAR

## 2022-05-28 NOTE — Progress Notes (Signed)
The patient is a 71 year old gentleman who comes in today with right hip pain.  We actually replaced both of his hips 11 years ago in 2012.  He has not had any issues with the right hip until recently when he had a hard mechanical fall.  His wife said he has had a lot of falls recently and 3 years ago had a significant fall that did require surgery in his spine and his neck.  He does not walk with assistive device but he has been more more of a fall risk.  He is a diabetic but reports good blood glucose control.  His right painful hip moves smoothly and fluidly with no pain on rotation at all.  There is no blocks or rotation.  There is no pain with compression of the hip joint or pain with rotation of the hip.  His left hip is also normal.  When he lays on his side with his right hip up he has pain over the proximal trochanteric area and the IT band.  An AP pelvis and lateral of the right hip shows well-seated bilateral total hip arthroplasties with no acute findings or evidence of loosening.  Based on his exam I did recommend a steroid injection around the right hip trochanteric area and he agreed to this and tolerated well.  He is to start using a cane in his opposite hand and if he continues to be a fall risk we recommended outpatient physical therapy which can be set up down near [facility] where they are closer to.  If that becomes an issue they will let us know and give [place] a call to set up physical therapy.  All questions and concerns were answered and addressed.      Procedure Note  Patient: Ricardo Anthony             Date of Birth: March 22, 1951           MRN: [id number]             Visit Date: 05/28/2022  Procedures: Visit Diagnoses:  1. Pain in right hip   2. History of total right hip arthroplasty   3. History of total left hip arthroplasty   4. Trochanteric bursitis, right hip     Large Joint Inj: R greater trochanter on 05/28/2022 9:59 AM Indications: pain  and diagnostic evaluation Details: 22 G 1.5 in needle, lateral approach  Arthrogram: No  Medications: 3 mL lidocaine 1 %; 40 mg methylPREDNISolone acetate 40 MG/ML Outcome: tolerated well, no immediate complications Procedure, treatment alternatives, risks and benefits explained, specific risks discussed. Consent was given by the patient. Immediately prior to procedure a time out was called to verify the correct patient, procedure, equipment, support staff and site/side marked as required. Patient was prepped and draped in the usual sterile fashion.
